# Patient Record
Sex: Male | Born: 1980 | Race: White | Hispanic: No | Marital: Married | Smoking: Never smoker
Health system: Southern US, Community
[De-identification: ages and names within clinical notes are randomized; demographics above are authoritative.]

## PROBLEM LIST (undated history)

## (undated) DIAGNOSIS — K635 Polyp of colon: Secondary | ICD-10-CM

## (undated) DIAGNOSIS — J45909 Unspecified asthma, uncomplicated: Secondary | ICD-10-CM

## (undated) DIAGNOSIS — K529 Noninfective gastroenteritis and colitis, unspecified: Secondary | ICD-10-CM

## (undated) DIAGNOSIS — L309 Dermatitis, unspecified: Secondary | ICD-10-CM

## (undated) DIAGNOSIS — K219 Gastro-esophageal reflux disease without esophagitis: Secondary | ICD-10-CM

## (undated) DIAGNOSIS — T7840XA Allergy, unspecified, initial encounter: Secondary | ICD-10-CM

## (undated) HISTORY — PX: FINGER SURGERY: SHX640

## (undated) HISTORY — DX: Noninfective gastroenteritis and colitis, unspecified: K52.9

## (undated) HISTORY — DX: Dermatitis, unspecified: L30.9

## (undated) HISTORY — DX: Allergy, unspecified, initial encounter: T78.40XA

## (undated) HISTORY — DX: Gastro-esophageal reflux disease without esophagitis: K21.9

## (undated) HISTORY — DX: Unspecified asthma, uncomplicated: J45.909

## (undated) HISTORY — PX: GANGLION CYST EXCISION: SHX1691

---
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
## 1998-04-30 DIAGNOSIS — I8390 Asymptomatic varicose veins of unspecified lower extremity: Secondary | ICD-10-CM | POA: Insufficient documentation

## 2004-04-30 DIAGNOSIS — M2550 Pain in unspecified joint: Secondary | ICD-10-CM | POA: Insufficient documentation

## 2015-05-18 DIAGNOSIS — M545 Low back pain, unspecified: Secondary | ICD-10-CM | POA: Insufficient documentation

## 2015-05-18 DIAGNOSIS — K529 Noninfective gastroenteritis and colitis, unspecified: Secondary | ICD-10-CM | POA: Insufficient documentation

## 2015-06-13 ENCOUNTER — Encounter: Payer: BLUE CROSS/BLUE SHIELD | Attending: Family Medicine | Admitting: Dietician

## 2015-06-13 ENCOUNTER — Encounter: Payer: Self-pay | Admitting: Dietician

## 2015-06-13 VITALS — Ht 72.0 in | Wt 276.0 lb

## 2015-06-13 DIAGNOSIS — E669 Obesity, unspecified: Secondary | ICD-10-CM

## 2015-06-13 NOTE — Patient Instructions (Signed)
Include at least 2 meals per day with a small snack either at breakfast or lunch. Try to include at least 5 servings per day. Balance meals with protein, 2-4 servings of carbohydrate and non-starchy vegetables Estimate carbohydrate servings at meals and snacks. Spread 13-15 servings of carbohydrate over 3 meals and 2-3 snacks. Measure some portions of carbohydrate foods, especially starches. Read labels for carbohydrate grams remembering that 15 gms of carbohydrate equals one serving.

## 2015-06-13 NOTE — Progress Notes (Signed)
Medical Nutrition Therapy: Visit start time: 1615  end time: 1715 Assessment:  Diagnosis: obesity Past medical history: 18 year hx of chronic diarrhea, GERD, food allergies- bananas, oranges, hazelnuts, melons Psychosocial issues/ stress concerns: Patient rates his stress as moderate and indicates "ok" as to how he is dealing with his stress. Preferred learning method:  . Auditory Current weight: 276.1 lbs  Height: 72 in Medications, supplements: see list Progress and evaluation:  Patient in for initial nutrition assessment. States that weight loss is his main focus. He has been taking phentermine for approximately 3 weeks which has suppressed his appetite. He has lost approximately 10 lbs in that time. He reports that his previous diet was very high in fat, eating mainly fast foods at lunch and more fried foods for evening meal as well as large portions. Since making diet changes, he reports that diarrhea has resolved and he is now only having 1 bowel movement per day and has experienced some constipation. He presently is possibly over-restricting eating only prunes at breakfast, pretzels during his work day and an evening meal consisting of 6+ oz. of meat, and a starch such as potatoes or legumes. He eats an evening snack approximately 3 days per week. His present diet is low in vegetables, fruit, whole grains and fiber.  Physical activity: none  Dietary Intake:  Usual eating pattern includes 1 meals and 3 snacks per day. Dining out frequency: 3 meals per week.  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 6 oz. Lean meat (chicken, bison, lean ground beef), potatoes or legumes Snack: 3 x/week- 90 calorie ice cream  Beverages: water; has eliminated soda  Nutrition Care Education: Weight control: Discussed how a meal pattern of 3 meals and 1-2 snacks will increase chance of long-term success for weight loss as well  as fuel the body more evenly over the day. Instructed on a meal plan based on 2000 calories including carbohydrate counting and the ways to better balance carbohydrate, protein and non-starchy vegetables. Encouraged to make food choices based on what needs to be added to the diet to meet basic nutrient needs. Gave and reviewed sample menus.   Nutritional Diagnosis:  [place]-3.3 Overweight/obesity As related to history of high fat food choices and large portions.  As evidenced by diet history and history of 60 lb weight gain in past 3 years..  Intervention:  Patient set the following goals: Include at least 2 meals per day with a small snack either at breakfast or lunch. Try to include at least 5 servings per day of fruits/vegetables. Balance meals with protein, 2-4 servings of carbohydrate and non-starchy vegetables Estimate carbohydrate servings at meals and snacks. Spread 13-15 servings of carbohydrate over 2 meals and 2-3 snacks. Measure some portions of carbohydrate foods, especially starches. Read labels for carbohydrate grams remembering that 15 gms of carbohydrate equals one serving.  Education Materials given:  . Food lists/ Planning A Balanced Meal . Sample meal pattern/ menus . Goals/ instructions Learner/ who was taught:  . Patient  Level of understanding: . Partial understanding; needs review/ practice Learning barriers: . None Willingness to learn/ readiness for change: . Eager, change in progress Monitoring and Evaluation:  Dietary intake, exercise, , and body weight      follow up: 08/15/15 at 4:15

## 2015-06-17 DIAGNOSIS — Z8371 Family history of colonic polyps: Secondary | ICD-10-CM | POA: Insufficient documentation

## 2015-08-15 ENCOUNTER — Ambulatory Visit: Payer: BLUE CROSS/BLUE SHIELD | Admitting: Dietician

## 2015-09-16 ENCOUNTER — Encounter: Payer: Self-pay | Admitting: Dietician

## 2017-08-21 ENCOUNTER — Other Ambulatory Visit: Payer: Self-pay | Admitting: Family Medicine

## 2017-08-21 ENCOUNTER — Ambulatory Visit
Admission: RE | Admit: 2017-08-21 | Discharge: 2017-08-21 | Disposition: A | Discharge status: Home / Self Care | Payer: BLUE CROSS/BLUE SHIELD | Source: Ambulatory Visit | Attending: Family Medicine | Admitting: Family Medicine

## 2017-08-21 DIAGNOSIS — R52 Pain, unspecified: Secondary | ICD-10-CM

## 2017-08-21 DIAGNOSIS — R0781 Pleurodynia: Secondary | ICD-10-CM | POA: Diagnosis present

## 2017-08-21 DIAGNOSIS — M4186 Other forms of scoliosis, lumbar region: Secondary | ICD-10-CM | POA: Insufficient documentation

## 2017-11-11 DIAGNOSIS — N50811 Right testicular pain: Secondary | ICD-10-CM | POA: Insufficient documentation

## 2017-11-12 DIAGNOSIS — R109 Unspecified abdominal pain: Secondary | ICD-10-CM | POA: Insufficient documentation

## 2018-07-27 ENCOUNTER — Ambulatory Visit
Admission: EM | Admit: 2018-07-27 | Discharge: 2018-07-27 | Discharge status: Absent without leave | Payer: BLUE CROSS/BLUE SHIELD

## 2018-07-27 ENCOUNTER — Other Ambulatory Visit: Payer: Self-pay

## 2018-09-08 ENCOUNTER — Ambulatory Visit
Admission: EM | Admit: 2018-09-08 | Discharge: 2018-09-08 | Disposition: A | Discharge status: Home / Self Care | Payer: BLUE CROSS/BLUE SHIELD | Attending: Family Medicine | Admitting: Family Medicine

## 2018-09-08 ENCOUNTER — Ambulatory Visit (INDEPENDENT_AMBULATORY_CARE_PROVIDER_SITE_OTHER): Payer: BLUE CROSS/BLUE SHIELD

## 2018-09-08 ENCOUNTER — Encounter: Payer: Self-pay | Admitting: Emergency Medicine

## 2018-09-08 ENCOUNTER — Other Ambulatory Visit: Payer: Self-pay

## 2018-09-08 DIAGNOSIS — M546 Pain in thoracic spine: Secondary | ICD-10-CM

## 2018-09-08 HISTORY — DX: Polyp of colon: K63.5

## 2018-09-08 LAB — URINALYSIS, COMPLETE (UACMP) WITH MICROSCOPIC
Bacteria, UA: NONE SEEN
Bilirubin Urine: NEGATIVE
Glucose, UA: NEGATIVE mg/dL
Hgb urine dipstick: NEGATIVE
Ketones, ur: NEGATIVE mg/dL
Leukocytes,Ua: NEGATIVE
Nitrite: NEGATIVE
Protein, ur: NEGATIVE mg/dL
RBC / HPF: NONE SEEN RBC/hpf (ref 0–5)
Specific Gravity, Urine: 1.025 (ref 1.005–1.030)
Squamous Epithelial / HPF: NONE SEEN (ref 0–5)
WBC, UA: NONE SEEN WBC/hpf (ref 0–5)
pH: 5 (ref 5.0–8.0)

## 2018-09-08 MED ORDER — CYCLOBENZAPRINE HCL 10 MG PO TABS: 10.0000 mg | ORAL_TABLET | Freq: Every evening | ORAL | 0 refills | Status: DC | PRN

## 2018-09-08 NOTE — ED Triage Notes (Signed)
Pt c/o back pain. Pain located in the middle of his back and radiates to the right. Lying down makes the pain worse. Pain I causing him not to sleep well. Started about 4 days ago. He has been taking tylenol and ibuprofen and has helped.

## 2018-09-08 NOTE — ED Provider Notes (Signed)
[facility] ____________________________________________  Time seen: Approximately 10:58 AM  I have reviewed the triage vital signs and the nursing notes.   HISTORY  Chief Complaint Back Pain   HPI Rick Scott is a 38 y.o. male presenting for evaluation of mid back pain for last 4 days.  Patient states that the pain is predominantly in his mid back and sometimes radiates to right back, but does not radiate further.  States he feels the pain more when he lays down flat, and this does disrupt his sleeping.  States his back overall feels fine in the morning.  Also feels the pain when he arches his chest forward with elbows backwards and with reaching over his head.  States the pain is an aching type pain, currently mild.  Denies any abdominal pain, chest pain, epigastric discomfort, paresthesias, fevers, or shortness of breath.  No rash. Denies known injury or trauma. Denies dysuria, hematuria or abnormal urinary or bowel habits.  Patient states that he has chronic alternation of constipation and diarrhea, and states this is his normal without any acute change.  Last bowel movement was yesterday and described as normal with normal coloration, no blood.  Has previously had 1 kidney stone several years ago, without intervention required.  History of lumbar herniated disc pain resolved after spinal injections.  Has been intermittently taken Tylenol and ibuprofen over-the-counter which do help for a few hours.  Denies other aggravating alleviating factors.  PCP: [facility]   Past Medical History:  Diagnosis Date  . Allergy bananas, oranges, melons  . Chronic diarrhea    has improved recently as diet has improved  . Colon polyps   . GERD (gastroesophageal reflux disease)     There are no active problems to display for this patient.   Past Surgical History:  Procedure Laterality Date  . GANGLION CYST EXCISION       No current facility-administered medications for  this encounter.   Current Outpatient Medications:  .  azelastine (ASTELIN) 0.1 % nasal spray, , Disp: , Rfl:  .  Cetirizine HCl 10 MG CAPS, , Disp: , Rfl:  .  Ibuprofen 200 MG CAPS, Take by mouth as needed., Disp: , Rfl:  .  TRULANCE 3 MG TABS, , Disp: , Rfl:  .  cyclobenzaprine (FLEXERIL) 10 MG tablet, Take 1 tablet (10 mg total) by mouth at bedtime as needed for muscle spasms (pain). Do not drive while taking as can cause drowsiness, Disp: 15 tablet, Rfl: 0  Allergies Codeine  Family History  Problem Relation Age of Onset  . COPD Mother   . Diabetes Mother   . Cancer Mother   . Healthy Father     Social History Social History   Tobacco Use  . Smoking status: Never Smoker  . Smokeless tobacco: Never Used  Substance Use Topics  . Alcohol use: Not Currently    Alcohol/week: 0.0 standard drinks  . Drug use: Not Currently    Review of Systems Constitutional: No fever Cardiovascular: Denies chest pain. Respiratory: Denies shortness of breath. Gastrointestinal: No abdominal pain.  No nausea, no vomiting.   Genitourinary: Negative for dysuria. Musculoskeletal: Positive for back pain. Negative for extremity pain, paresthesias, or discomfort.  Skin: Negative for rash. Neurological: Negative for headaches, focal weakness or numbness.    ____________________________________________   PHYSICAL EXAM:  VITAL SIGNS: ED Triage Vitals  Enc Vitals Group     BP 09/08/18 1013 111/82     Pulse Rate 09/08/18 1013 81  Resp 09/08/18 1013 18     Temp 09/08/18 1013 98.2 F (36.8 C)     Temp Source 09/08/18 1013 Oral     SpO2 09/08/18 1013 99 %     Weight 09/08/18 1009 260 lb (117.9 kg)     Height 09/08/18 1009 6' (1.829 m)     Head Circumference --      Peak Flow --      Pain Score 09/08/18 1009 2     Pain Loc --      Pain Edu? --      Excl. in [place]? --     Constitutional: Alert and oriented. Well appearing and in no acute distress. ENT      Head: Normocephalic and  atraumatic. Cardiovascular: Normal rate, regular rhythm. Grossly normal heart sounds.  Good peripheral circulation. Respiratory: Normal respiratory effort without tachypnea nor retractions. Breath sounds are clear and equal bilaterally. No wheezes, rales, rhonchi. Gastrointestinal: Soft and nontender. No CVA tenderness. Musculoskeletal: No midline cervical or lumbar tenderness palpation.  Steady gait.  Changes positions quickly in room.  Bilateral pedal pulses equal and easily palpated. No midline thoracic tenderness to palpation, however tenderness present to midline thoracic and parathoracic at approximately T10 with left and right arm overhead stretching as well as pulling elbows backwards, no rash, no bulge, full range of motion present, no pain with lumbar flexion or extension, per patient mild pain with right and left lumbar rotation to thoracic back.  No saddle anesthesia. Neurologic:  Normal speech and language. No gross focal neurologic deficits are appreciated. Speech is normal. No gait instability.  Skin:  Skin is warm, dry and intact. No rash noted. Psychiatric: Mood and affect are normal. Speech and behavior are normal. Patient exhibits appropriate insight and judgment   ___________________________________________   LABS (all labs ordered are listed, but only abnormal results are displayed)  Labs Reviewed  URINALYSIS, COMPLETE (UACMP) WITH MICROSCOPIC   ____________________________________________  RADIOLOGY  Dg Thoracic Spine 2 View  Result Date: 09/08/2018 CLINICAL DATA:  38 year old male with mid to lower thoracic spine pain the past 4 days. No known injury. EXAM: THORACIC SPINE 2 VIEWS COMPARISON:  Prior chest x-ray with right rib series 08/21/2017 FINDINGS: No evidence of acute fracture or malalignment. Dextroconvex and rotary scoliosis is present with the apex at L1. No lytic or blastic osseous lesion is identified. Minimal degenerative changes with mild endplate spurring.  No significant loss of disc space height. High density pulmonary nodules bilaterally likely represent small calcified granuloma. IMPRESSION: 1. No acute fracture, malalignment or bony lesion. 2. Mild dextroconvex and rotary scoliosis of the lumbar spine with the apex at L1. 3. Mild multilevel degenerative endplate spurring. No significant loss of disc space height. Electronically Signed   By: Jacqulynn Cadet M.D.   On: 09/08/2018 11:22   ____________________________________________   PROCEDURES Procedures   _____________________________________   INITIAL IMPRESSION / ASSESSMENT AND PLAN / ED COURSE  Pertinent labs & imaging results that were available during my care of the patient were reviewed by me and considered in my medical decision making (see chart for details).  Well-appearing patient.  No acute distress.  Presenting for evaluation of 4 days of lower thoracic back pain.  Discussed multiple differentials with patient, suspect musculoskeletal.  Urinalysis overall unremarkable.  Thoracic spine x-ray as above per radiologist, discussed the results with patient, no acute fracture malalignment or lesion, mild dextroconvex and very scoliosis as well as multilevel degenerative changes.  Also informed of incidental noting  of calcified granuloma, and recommend for follow-up with primary care regarding.  Suspect musculoskeletal back pain.  Continue over-the-counter Tylenol ibuprofen during the day as needed, Flexeril at night.  Recommend follow-up with orthopedic, as well as stretching.  Patient states that his wife is a physical therapist and will work with him him for thoracic stretching.Discussed indication, risks and benefits of medications with patient.  Discussed follow up with Primary care physician this week. Discussed follow up and return parameters including no resolution or any worsening concerns. Patient verbalized understanding and agreed to plan.    ____________________________________________   FINAL CLINICAL IMPRESSION(S) / ED DIAGNOSES  Final diagnoses:  Acute midline thoracic back pain     ED Discharge Orders         Ordered    cyclobenzaprine (FLEXERIL) 10 MG tablet  At bedtime PRN     09/08/18 1138           Note: This dictation was prepared with Dragon dictation along with smaller phrase technology. Any transcriptional errors that result from this process are unintentional.         Marylene Land, NP 09/08/18 1146

## 2018-09-08 NOTE — Discharge Instructions (Addendum)
Take medication as prescribed. Rest. Drink plenty of fluids.  Continue over-the-counter Tylenol and ibuprofen as needed.  Stretch as discussed.  Follow-up with orthopedic for continued pain.  Follow up with your primary care physician this week as needed. Return to Urgent care for new or worsening concerns.

## 2019-02-11 ENCOUNTER — Encounter: Payer: Self-pay | Admitting: Dietician

## 2019-02-11 ENCOUNTER — Encounter: Payer: BLUE CROSS/BLUE SHIELD | Attending: Family Medicine | Admitting: Dietician

## 2019-02-11 ENCOUNTER — Other Ambulatory Visit: Payer: Self-pay

## 2019-02-11 VITALS — Ht 72.0 in | Wt 274.5 lb

## 2019-02-11 DIAGNOSIS — Z6837 Body mass index (BMI) 37.0-37.9, adult: Secondary | ICD-10-CM | POA: Diagnosis not present

## 2019-02-11 DIAGNOSIS — E6609 Other obesity due to excess calories: Secondary | ICD-10-CM

## 2019-02-11 NOTE — Patient Instructions (Signed)
   Reduce the frequency of dessert with/ after supper.   When eating restaurant meals, limit/ avoid fried foods and opt for baked/ grilled; plan to eat 1/2 portions (can eat all of the vegetables).  Use menus for appropriate nutritional balance and food portions.

## 2019-02-11 NOTE — Progress Notes (Signed)
Medical Nutrition Therapy: Visit start time: 1440  end time: 1545  Assessment:  Diagnosis: abnormal weight gain Past medical history: GERD, food allergies to several fruits, hazelnuts, seafood Psychosocial issues/ stress concerns: none  Preferred learning method:  . Auditory . Hands-on  Current weight: 274.5lbs Height: 6'0" Medications, supplements: reconciled list in medical record  Progress and evaluation:   Patient reports on and off dieting several times over years, loses 80lbs and then regains.   He has tried counting calories, pre-made meals, stress management-- have all helped promote weight loss.   Reports getting overheated easily when exercising which leads to nausea, so needs low intensity.    He has had chronic constipation, now treating with Trulance, which leads to diarrhea/ loose stools  He has multiple food allergies/ intolerances, including several fruits especially bananas and oranges (GI pain), also avocado and hazelnuts (burning in throat), tomato products (GERD). He does not eat seafood; he reports sensitivity to some food textures.    Physical activity: walking 60-120 minutes 0-1x a week (often at [facility] zoo)  Dietary Intake:  Usual eating pattern includes 3 meals and 1 snacks per day. Dining out frequency: 2-3 meals per week.  Breakfast: bagel with cream cheese; occ donut, coffee with equal and half & half 2 Tablespoons Snack: none Lunch: varies -- frozen pizza/ quick meal, usu at home Snack: occasional coffee drink; occasionally beef and cheese stick if long time between meals Supper: 8-9pm-- pt cooks wife works late -- [employer] with veg and potato; chicken alfredo with broccoli; tacos; hamburgers, bratwurst + veg + somtimes potato; often with dessert (wants to stop desserts) Snack: none  Beverages: water, coffee  Nutrition Care Education: Topics covered: weight control Basic nutrition: basic food groups, appropriate nutrient balance, appropriate meal and  snack schedule, general nutrition guidelines    Weight control: importance of low sugar and low fat choices, portion control strategies including increasing low-carb vegetables, estimated energy needs at 1700kcal, provided guidance for 45% CHO, 25% protein, and 30% fat; discussed options for balanced meals and lower-sugar and calorie dessert alternatives; discussed tracking food intake; role of physical activity; discussed appropriate frequency of weight checks as weekly to daily with understanding that weight will naturally fluctuate within a few pounds.  Advanced nutrition:  dining out   Nutritional Diagnosis:  [place]-3.3 Overweight/obesity As related to excess calories and inadequate physical activity, history of weight fluctuation.  As evidenced by patient with current BMI of 37, making some lifestyle changes to promote sustainable weight loss.  Intervention:   Instruction as noted above.  Patient has some understanding of basic nutrition and seeks help with making habit changes he can maintain over time.   He would like to avoid tracking food intake if possible.   Established goals for change with direction from patient.    Education Materials given:  . Plate Planner with food lists . Sample meal pattern/ menus . Goals/ instructions   Learner/ who was taught:  . Patient   Level of understanding: Marland Kitchen Verbalizes/ demonstrates competency   Demonstrated degree of understanding via:   Teach back Learning barriers: . None  Willingness to learn/ readiness for change: . Eager, change in progress  Monitoring and Evaluation:  Dietary intake, exercise, and body weight      follow up: 03/11/19 at 1:30pm

## 2019-03-11 ENCOUNTER — Encounter: Payer: Self-pay | Admitting: Dietician

## 2019-03-11 ENCOUNTER — Encounter: Payer: BLUE CROSS/BLUE SHIELD | Attending: Family Medicine | Admitting: Dietician

## 2019-03-11 ENCOUNTER — Other Ambulatory Visit: Payer: Self-pay

## 2019-03-11 VITALS — Ht 72.0 in | Wt 264.0 lb

## 2019-03-11 DIAGNOSIS — Z6837 Body mass index (BMI) 37.0-37.9, adult: Secondary | ICD-10-CM

## 2019-03-11 DIAGNOSIS — E6609 Other obesity due to excess calories: Secondary | ICD-10-CM

## 2019-03-11 NOTE — Progress Notes (Signed)
Medical Nutrition Therapy: Visit start time: [id number]  end time: 1400  Assessment:  Diagnosis: abnormal weight gain Medical history changes: none Psychosocial issues/ stress concerns: none   Current weight: 264.0lbs Height: 6'0" Medications, supplements: no changes since previous visit  Progress and evaluation:   Weight loss of over 10lbs since previous visit on 02/11/19.   Patient reports less restaurant foods recently and heatlhier choices when eating out.    He has decreased the frequency of desserts/ sweets eaten and is making lower-calorie choices when he does eat something sweet.   His goal is to lose 60-70lbs within the next year.   Physical activity: no structured activity due to breathing issues; plans to increase activity as he is able.   Dietary Intake:  Usual eating pattern includes 3 meals and 1 snacks per day. Dining out frequency: <2 meals per week.  Breakfast: 1/2 bagel with cream cheese or whole grain English muffin with peanut butter + coffee Snack: none Lunch: sandwich on thin bread with [place], cheese, sm amt mayo, mustard + brocc and cheese or riced cauliflower with cheese Snack: beef and cheese stick Supper: pork chop/ chicken breast + veg + beans/ legumes or occasionally baked or mashed potato; taco salad with no shell with lean meat/ gr [place]. Sometimes low-cal ice cream bar for dessert max 250kcal Snack: none Beverages: 1-2c coffee, water  Nutrition Care Education: Topics covered:  Weight control: determining reasonable weight loss rate, reviewed benefits of emphasizing low-carb vegetables and lean protein foods to help with satiety while controlling poritons of starchy foods and sweets.    Nutritional Diagnosis:  [place]-3.3 Overweight/obesity As related to history of excess calories and inactivity.  As evidenced by patient with current BMI of 35.8, following calorie-controlled eating pattern for ongoing weight loss.  Intervention:   Discussion as noted  above.  Patient has made positive dietary changes to promote weight loss, and is motivated to continue.  He feels he can continue with his current healthy habits. No additional goals for change at this time.       Learner/ who was taught:  . Patient   Level of understanding: Marland Kitchen Verbalizes/ demonstrates competency   Demonstrated degree of understanding via:   Teach back Learning barriers: . None  Willingness to learn/ readiness for change: . Eager, change in progress  Monitoring and Evaluation:  Dietary intake, exercise, and body weight      follow up: 04/13/19 at 3:00pm virtual visit

## 2019-03-11 NOTE — Patient Instructions (Signed)
   Continue with current eating pattern, great job!

## 2019-04-13 ENCOUNTER — Encounter: Payer: BLUE CROSS/BLUE SHIELD | Attending: Family Medicine | Admitting: Dietician

## 2019-04-13 VITALS — Ht 72.0 in | Wt 252.0 lb

## 2019-04-13 DIAGNOSIS — E6609 Other obesity due to excess calories: Secondary | ICD-10-CM | POA: Diagnosis not present

## 2019-04-13 DIAGNOSIS — Z713 Dietary counseling and surveillance: Secondary | ICD-10-CM | POA: Diagnosis not present

## 2019-04-13 DIAGNOSIS — Z6834 Body mass index (BMI) 34.0-34.9, adult: Secondary | ICD-10-CM

## 2019-04-13 NOTE — Progress Notes (Signed)
Medical Nutrition Therapy: Visit start time: [id number]  end time: 1525  Assessment:  Diagnosis: abnormal weight gain Medical history changes: no changes Psychosocial issues/ stress concerns: none  Current weight: 252lbs (patient reported, am weight) Height: 6'0" Medications, supplement changes: added Cymbalta, stopped Zoloft  Progress and evaluation:  . Weight loss of about 10-12lbs in the past 5 weeks.  . Patient continues to monitor his intake closely and is making healthy food choices, emphasizing lean protein foods and vegetables. He is limiting grains and sweets. . Incorporating physical activity is more difficult due to wearing mask with asthma.     Physical activity: some walking with dog, outings to zoo and science center  Dietary Intake:  Usual eating pattern includes 3 meals and 1 snacks per day. Dining out frequency: not assessed today.  Breakfast: English muffin with peanut butter; 1/2 bagel with cream cheese (< 250kcal) Snack: none Lunch: sugar free bread ([id number] 2 slices), with pastrami or [place] and cheese, small amount mayo + steamed cauliflower or broccoli with cheese sauce (350-400kcal total for meal per patient) Snack: cheese stick Supper: baked chicken, thin pork chop or other lean meat + beans/ peas + veg; tacos without shell; occasional 200kcal ice cream bar for dessert Snack: none Beverages: water, 1c. Coffee in am  Nutrition Care Education: Topics covered:     Weight control: reviewed progress since previous visit, determining reasonable weight loss rate, options for increasing physical activity, importance of contentment with eating pattern and food choices for ongoing success   Nutritional Diagnosis:  [place]-3.3 Overweight/obesity As related to history of excess calories and inactivity.  As evidenced by patient with current BMI of 34.18, following calorie-controlled eating pattern for ongoing weight loss.  Intervention:  . Discussion and instruction as noted  above. . Patient continues to lose weight steadily, and has concrete goals for maintaining healthy habits.  . No additional goals for change at this time.    Learner/ who was taught:  . Patient   Level of understanding: Marland Kitchen Verbalizes/ demonstrates competency   Demonstrated degree of understanding via:   Teach back Learning barriers: . None  Willingness to learn/ readiness for change: . Eager, change in progress  Monitoring and Evaluation:  Dietary intake, exercise, and body weight      follow up: 05/20/19 at 2:00pm

## 2019-04-28 DIAGNOSIS — J4540 Moderate persistent asthma, uncomplicated: Secondary | ICD-10-CM | POA: Insufficient documentation

## 2019-04-28 DIAGNOSIS — Z6834 Body mass index (BMI) 34.0-34.9, adult: Secondary | ICD-10-CM | POA: Insufficient documentation

## 2019-05-20 ENCOUNTER — Ambulatory Visit (INDEPENDENT_AMBULATORY_CARE_PROVIDER_SITE_OTHER): Payer: BLUE CROSS/BLUE SHIELD | Admitting: Dietician

## 2019-05-20 ENCOUNTER — Ambulatory Visit: Payer: BLUE CROSS/BLUE SHIELD | Admitting: Dietician

## 2019-05-20 VITALS — Ht 72.0 in | Wt 253.5 lb

## 2019-05-20 DIAGNOSIS — Z6834 Body mass index (BMI) 34.0-34.9, adult: Secondary | ICD-10-CM

## 2019-05-20 DIAGNOSIS — E6609 Other obesity due to excess calories: Secondary | ICD-10-CM

## 2019-05-20 NOTE — Progress Notes (Signed)
Medical Nutrition Therapy: Visit start time: [id number]  end time: 1410  Assessment:  Diagnosis: obesity Medical history changes: no changes Psychosocial issues/ stress concerns: no changes  Current weight: 253.5lbs Height: 6'0" Medications, supplement changes: no changes  Progress and evaluation:  . Patient reports some weight gain since last visit, highest weight has been 256lbs, due to holiday eating and travel, followed by mother's sudden death 05-31-19 -- he travelled to [place] and spent some time in hotel setting, therefore more restaurant meals.  Marland Kitchen He feels he is now back to normal eating pattern and is resuming some physical activity by increasing general daily movement and walking dog. Will be getting a puppy this weekend which will entail more walking.  . Patient has experienced some coughing soon after eating, and thinks it could be related to acid reflux, but unsure. He does occasionally have heartburn symptoms, not necessarily in conjunction with coughing. He uses Tums when needed.    Physical activity: walking dog, increased ADLs  Dietary Intake:  Usual eating pattern includes 3 meals and 1 snacks per day. Dining out frequency: not assessed today.  Breakfast: English muffin with peanut butter; 1/2 bagel with cream cheese Snack: none Lunch: thin sliced bread with meat and cheese + veg.; leftovers Snack: cheese stick Supper: lean meat + beans + low-carb veg Snack: none Beverages: water, coffee in am  Nutrition Care Education: Topics covered:  Weight control: reviewed progress since previous visit, reasonable weight loss rate, options for increasing physical activity Other: coughing after eating, whether related to reflux or other issue, advised discussing with PCP.   Nutritional Diagnosis:  [place]-3.3 Overweight/obesity As related to history of excess calories and limited physical activity.  As evidenced by patient with current BMI of 34, following eating pattern and increasing  physical activity to promote ongoing weight loss.  Intervention:  . Discussion as noted above. . Patient feels confident he can continue with healthy eating pattern and regular activity.  Marland Kitchen He will discuss his recent coughing episodes with PCP to determine cause.  Marland Kitchen He will schedule MNT follow-up after working to resolve this coughing issue.    Learner/ who was taught:  . Patient   Level of understanding: Marland Kitchen Verbalizes/ demonstrates competency   Demonstrated degree of understanding via:   Teach back Learning barriers: . None  Willingness to learn/ readiness for change: . Eager, change in progress  Monitoring and Evaluation:  Dietary intake, exercise, and body weight      follow up: prn

## 2019-06-11 ENCOUNTER — Ambulatory Visit
Admission: EM | Admit: 2019-06-11 | Discharge: 2019-06-11 | Disposition: A | Discharge status: Home / Self Care | Payer: BLUE CROSS/BLUE SHIELD | Attending: Family Medicine | Admitting: Family Medicine

## 2019-06-11 ENCOUNTER — Ambulatory Visit (INDEPENDENT_AMBULATORY_CARE_PROVIDER_SITE_OTHER): Payer: BLUE CROSS/BLUE SHIELD

## 2019-06-11 ENCOUNTER — Other Ambulatory Visit: Payer: Self-pay

## 2019-06-11 ENCOUNTER — Encounter: Payer: Self-pay | Admitting: Emergency Medicine

## 2019-06-11 DIAGNOSIS — W228XXA Striking against or struck by other objects, initial encounter: Secondary | ICD-10-CM

## 2019-06-11 DIAGNOSIS — M79674 Pain in right toe(s): Secondary | ICD-10-CM

## 2019-06-11 DIAGNOSIS — S99921A Unspecified injury of right foot, initial encounter: Secondary | ICD-10-CM | POA: Diagnosis not present

## 2019-06-11 MED ORDER — NAPROXEN 500 MG PO TABS: 500.0000 mg | ORAL_TABLET | Freq: Two times a day (BID) | ORAL | 0 refills | Status: DC

## 2019-06-11 MED ORDER — TRAMADOL HCL 50 MG PO TABS: 50.0000 mg | ORAL_TABLET | Freq: Four times a day (QID) | ORAL | 0 refills | Status: DC | PRN

## 2019-06-11 NOTE — ED Triage Notes (Addendum)
Patient in today c/o 2nd toe injury on his right foot. Patient states that he slipped on a dog bone and hit his toe on a piece of furniture last night.

## 2019-06-11 NOTE — Discharge Instructions (Addendum)
-  Naproxen: 1 tablet twice a day for pain inflammation.  Do not take ibuprofen while taking the naproxen -Can also use Tylenol for pain -Tramadol 1 tablet every 6 hours as needed for pain not improved with naproxen or Tylenol. -Ice elevation as noted on attached -Return to clinic if symptoms worsen or not improve

## 2019-06-11 NOTE — ED Provider Notes (Signed)
[facility]    CSN: [id number] Arrival date & time: 06/11/19  1423      History   Chief Complaint Chief Complaint  Patient presents with  . Toe Injury    DOI 06/10/19    HPI Rick Scott is a 39 y.o. male.   Patient is a 39 year old male who presents with complaint to right second toe.  Patient states that he slipped on dog bone in the house and kicked a piece of furniture.  Patient states he is not take any medications or done anything for the injury.  Patient states the pain is all worse this morning as well as a swelling.  Patient reports allergies to codeine.     Past Medical History:  Diagnosis Date  . Allergy bananas, oranges, melons  . Chronic diarrhea    has improved recently as diet has improved  . Colon polyps   . GERD (gastroesophageal reflux disease)     There are no problems to display for this patient.   Past Surgical History:  Procedure Laterality Date  . GANGLION CYST EXCISION         Home Medications    Prior to Admission medications   Medication Sig Start Date End Date Taking? Authorizing Provider  albuterol (VENTOLIN HFA) 108 (90 Base) MCG/ACT inhaler Inhale into the lungs. 12/05/16  Yes [provider]  calcium elemental as carbonate (BARIATRIC TUMS ULTRA) 400 MG chewable tablet Chew by mouth.   Yes [provider]  Cetirizine HCl 10 MG CAPS  04/30/08  Yes [provider]  DULoxetine (CYMBALTA) 20 MG capsule Take by mouth. 03/19/19 03/18/20 Yes [provider]  Ibuprofen 200 MG CAPS Take by mouth as needed.   Yes [provider]  mometasone (NASONEX) 50 MCG/ACT nasal spray Place 2 sprays into the nose daily. 06/08/19  Yes [provider]  Multiple Vitamin (MULTIVITAMIN) tablet Take 1 tablet by mouth daily.   Yes [provider]  phentermine 15 MG capsule Take by mouth. 01/27/19  Yes [provider]  TRULANCE 3 MG TABS  09/05/18  Yes [provider]    naproxen (NAPROSYN) 500 MG tablet Take 1 tablet (500 mg total) by mouth 2 (two) times daily. 06/11/19   Luvenia Redden, PA-C  traMADol (ULTRAM) 50 MG tablet Take 1 tablet (50 mg total) by mouth every 6 (six) hours as needed. 06/11/19   Luvenia Redden, PA-C  azelastine (ASTELIN) 0.1 % nasal spray  09/05/18 06/11/19  [provider]    Family History Family History  Problem Relation Age of Onset  . COPD Mother   . Diabetes Mother   . Cancer Mother   . Healthy Father     Social History Social History   Tobacco Use  . Smoking status: Never Smoker  . Smokeless tobacco: Never Used  Substance Use Topics  . Alcohol use: Not Currently    Alcohol/week: 0.0 standard drinks  . Drug use: Not Currently     Allergies   Avocado, Banana, Corylus, Orange oil, Codeine, Fluticasone, Other, and Bupropion   Review of Systems Review of Systems as noted above in HPI.  Other systems reviewed and found to be negative   Physical Exam Triage Vital Signs ED Triage Vitals  Enc Vitals Group     BP 06/11/19 1438 114/85     Pulse Rate 06/11/19 1438 84     Resp 06/11/19 1438 18     Temp 06/11/19 1438 98.3 F (36.8 C)  Temp Source 06/11/19 1438 Oral     SpO2 06/11/19 1438 99 %     Weight 06/11/19 1438 255 lb (115.7 kg)     Height 06/11/19 1438 6' (1.829 m)     Head Circumference --      Peak Flow --      Pain Score 06/11/19 1437 4     Pain Loc --      Pain Edu? --      Excl. in [place]? --    No data found.  Updated Vital Signs BP 114/85 (BP Location: Left Arm)   Pulse 84   Temp 98.3 F (36.8 C) (Oral)   Resp 18   Ht 6' (1.829 m)   Wt 255 lb (115.7 kg)   SpO2 99%   BMI 34.58 kg/m    Physical Exam Constitutional:      Appearance: Normal appearance.  Musculoskeletal:       Feet:  Neurological:     General: No focal deficit present.     Mental Status: He is alert and oriented to person, place, and time.      UC Treatments / Results  Labs (all labs ordered are  listed, but only abnormal results are displayed) Labs Reviewed - No data to display  EKG   Radiology DG Toe 2nd Right  Result Date: 06/11/2019 CLINICAL DATA:  39 year old who struck the RIGHT second toe on furniture at home last night. Persistent pain. Initial encounter. EXAM: RIGHT SECOND TOE - 3 VIEWS COMPARISON:  None. FINDINGS: No evidence of acute fracture or dislocation. Joint spaces well preserved. Well-preserved bone mineral density. No intrinsic osseous abnormalities. IMPRESSION: Normal examination. Electronically Signed   By: Evangeline Dakin M.D.   On: 06/11/2019 15:00    Procedures Procedures (including critical care time)  Medications Ordered in UC Medications - No data to display  Initial Impression / Assessment and Plan / UC Course  I have reviewed the triage vital signs and the nursing notes.  Pertinent labs & imaging results that were available during my care of the patient were reviewed by me and considered in my medical decision making (see chart for details).     Patient with pain and swelling to the right second toe after slipping on a dog bone and kicking a piece of furniture.  Pain and tenderness around the first PIP joint.  Some decreased range of motion due to pain.  X-ray with no acute fracture dislocation.  Patient given a formation regarding buddy tape ice and elevation.  Patient prescription for naproxen as well as Ultram if needed.  Final Clinical Impressions(s) / UC Diagnoses   Final diagnoses:  Toe injury, right, initial encounter     Discharge Instructions     -Naproxen: 1 tablet twice a day for pain inflammation.  Do not take ibuprofen while taking the naproxen -Can also use Tylenol for pain -Tramadol 1 tablet every 6 hours as needed for pain not improved with naproxen or Tylenol. -Ice elevation as noted on attached -Return to clinic if symptoms worsen or not improve    ED Prescriptions    Medication Sig Dispense Auth. Provider    naproxen (NAPROSYN) 500 MG tablet Take 1 tablet (500 mg total) by mouth 2 (two) times daily. 30 tablet Luvenia Redden, PA-C   traMADol (ULTRAM) 50 MG tablet Take 1 tablet (50 mg total) by mouth every 6 (six) hours as needed. 15 tablet Luvenia Redden, PA-C     I have reviewed the [place]  during this encounter.   Luvenia Redden, PA-C 06/11/19 1517

## 2019-07-01 DIAGNOSIS — G4733 Obstructive sleep apnea (adult) (pediatric): Secondary | ICD-10-CM | POA: Insufficient documentation

## 2019-07-05 ENCOUNTER — Ambulatory Visit: Payer: BLUE CROSS/BLUE SHIELD | Attending: Internal Medicine

## 2019-07-05 ENCOUNTER — Ambulatory Visit: Payer: BLUE CROSS/BLUE SHIELD

## 2019-07-05 DIAGNOSIS — Z23 Encounter for immunization: Secondary | ICD-10-CM

## 2019-07-05 NOTE — Progress Notes (Signed)
   Covid-19 Vaccination Clinic  Name:  Rick Scott    MRN: [id number] DOB: [birth year]  07/05/2019  Mr. Rick Scott was observed post Covid-19 immunization for 15 minutes without incident. He was provided with Vaccine Information Sheet and instruction to access the V-Safe system.   Mr. Rick Scott was instructed to call 911 with any severe reactions post vaccine: Marland Kitchen Difficulty breathing  . Swelling of face and throat  . A fast heartbeat  . A bad rash all over body  . Dizziness and weakness   Immunizations Administered    Name Date Dose VIS Date Route   Pfizer COVID-19 Vaccine 07/05/2019  1:58 PM 0.3 mL 04/10/2019 Intramuscular   Manufacturer: [place]   Lot: [id number]   [place]: [id number]

## 2019-07-29 ENCOUNTER — Ambulatory Visit: Payer: BLUE CROSS/BLUE SHIELD | Attending: Internal Medicine

## 2019-07-29 DIAGNOSIS — Z23 Encounter for immunization: Secondary | ICD-10-CM

## 2019-07-29 NOTE — Progress Notes (Signed)
   Covid-19 Vaccination Clinic  Name:  Rick Scott    MRN: [id number] DOB: 02/02/1981  07/29/2019  [name] was observed post Covid-19 immunization for 15 minutes without incident. He was provided with Vaccine Information Sheet and instruction to access the V-Safe system.   [name] was instructed to call 911 with any severe reactions post vaccine: Marland Kitchen Difficulty breathing  . Swelling of face and throat  . A fast heartbeat  . A bad rash all over body  . Dizziness and weakness   Immunizations Administered    Name Date Dose VIS Date Route   Pfizer COVID-19 Vaccine 07/29/2019  3:02 PM 0.3 mL 04/10/2019 Intramuscular   Manufacturer: [place]   Lot: [id number]   [place]: [id number]

## 2020-07-18 ENCOUNTER — Other Ambulatory Visit: Payer: Self-pay

## 2020-07-18 ENCOUNTER — Ambulatory Visit (INDEPENDENT_AMBULATORY_CARE_PROVIDER_SITE_OTHER): Payer: No Typology Code available for payment source | Admitting: Vascular Surgery

## 2020-07-18 ENCOUNTER — Encounter (INDEPENDENT_AMBULATORY_CARE_PROVIDER_SITE_OTHER): Payer: Self-pay | Admitting: Vascular Surgery

## 2020-07-18 VITALS — BP 117/81 | HR 88 | Resp 16 | Ht 72.0 in | Wt 276.0 lb

## 2020-07-18 DIAGNOSIS — J4540 Moderate persistent asthma, uncomplicated: Secondary | ICD-10-CM | POA: Diagnosis not present

## 2020-07-18 DIAGNOSIS — I8392 Asymptomatic varicose veins of left lower extremity: Secondary | ICD-10-CM | POA: Diagnosis not present

## 2020-07-18 NOTE — Progress Notes (Signed)
MRN : [id number]  Rick Scott is a 40 y.o. ([birth year]) male who presents with chief complaint of  Chief Complaint  Patient presents with  . New Patient (Initial Visit)    Ref uncomplicated vv with knee swelling  .  History of Present Illness:   The patient is seen for evaluation of an asymptomatic varicose vein. Recently he had a "knot" come up abruptly over the left knee that seemed to be related to the large varicose vein in that area.  Symptoms have essentially resolved at this time.  No history of trauma.  There is no history of DVT, PE or prior superficial thrombophlebitis. There is no history of ulceration or hemorrhage. The patient has a significant family history of varicose veins in his father who did have and ablation procedure which was complicated with DVT.  There is no history of prior surgical intervention or sclerotherapy.    Current Meds  Medication Sig  . albuterol (VENTOLIN HFA) 108 (90 Base) MCG/ACT inhaler Inhale into the lungs.  . calcium elemental as carbonate (BARIATRIC TUMS ULTRA) 400 MG chewable tablet Chew by mouth.  . Cetirizine HCl 10 MG CAPS   . Ibuprofen 200 MG CAPS Take by mouth as needed.  Marland Kitchen LINZESS 72 MCG capsule Take 72 mcg by mouth every morning.  . metFORMIN (GLUCOPHAGE-XR) 500 MG 24 hr tablet Take 500 mg by mouth daily.  . mometasone (NASONEX) 50 MCG/ACT nasal spray Place 2 sprays into the nose daily.  . Multiple Vitamin (MULTIVITAMIN) tablet Take 1 tablet by mouth daily.  . naproxen (NAPROSYN) 500 MG tablet Take 1 tablet (500 mg total) by mouth 2 (two) times daily.  . traMADol (ULTRAM) 50 MG tablet Take 1 tablet (50 mg total) by mouth every 6 (six) hours as needed.    Past Medical History:  Diagnosis Date  . Allergy bananas, oranges, melons  . Chronic diarrhea    has improved recently as diet has improved  . Colon polyps   . GERD (gastroesophageal reflux disease)     Past Surgical History:  Procedure Laterality Date  .  GANGLION CYST EXCISION      Social History Social History   Tobacco Use  . Smoking status: Never Smoker  . Smokeless tobacco: Never Used  Vaping Use  . Vaping Use: Never used  Substance Use Topics  . Alcohol use: Not Currently    Alcohol/week: 0.0 standard drinks  . Drug use: Not Currently    Family History Family History  Problem Relation Age of Onset  . COPD Mother   . Diabetes Mother   . Cancer Mother   . Healthy Father   No family history of bleeding/clotting disorders, porphyria or autoimmune disease   Allergies  Allergen Reactions  . Avocado Swelling    Burning in throat   . Banana Swelling  . Corylus Swelling  . Orange Oil Nausea And Vomiting  . Codeine Nausea And Vomiting    Flu like symptoms  . Fluticasone Other (See Comments)    Urinary frequency/incontinence  . [place]) Allergy Skin Test   . Other Other (See Comments)    Burning in throat Burning in throat   . Bupropion Other (See Comments)    Dissociative symptoms     REVIEW OF SYSTEMS (Negative unless checked)  Constitutional: [] Weight loss  [] Fever  [] Chills Cardiac: [] Chest pain   [] Chest pressure   [] Palpitations   [] Shortness of breath when laying flat   [] Shortness of breath with exertion. Vascular:  []   Pain in legs with walking   [] Pain in legs at rest  [] History of DVT   [] Phlebitis   [] Swelling in legs   [x] Varicose veins   [] Non-healing ulcers Pulmonary:   [] Uses home oxygen   [] Productive cough   [] Hemoptysis   [] Wheeze  [] COPD   [] Asthma Neurologic:  [] Dizziness   [] Seizures   [] History of stroke   [] History of TIA  [] Aphasia   [] Vissual changes   [] Weakness or numbness in arm   [] Weakness or numbness in leg Musculoskeletal:   [] Joint swelling   [] Joint pain   [] Low back pain Hematologic:  [] Easy bruising  [] Easy bleeding   [] Hypercoagulable state   [] Anemic Gastrointestinal:  [] Diarrhea   [] Vomiting  [] Gastroesophageal reflux/heartburn   [] Difficulty  swallowing. Genitourinary:  [] Chronic kidney disease   [] Difficult urination  [] Frequent urination   [] Blood in urine Skin:  [] Rashes   [] Ulcers  Psychological:  [] History of anxiety   []  History of major depression.  Physical Examination  Vitals:   07/18/20 0940  BP: 117/81  Pulse: 88  Resp: 16  Weight: 276 lb (125.2 kg)  Height: 6' (1.829 m)   Body mass index is 37.43 kg/m. Gen: WD/WN, NAD Head: [place]/AT, No temporalis wasting.  Ear/Nose/Throat: Hearing grossly intact, nares w/o erythema or drainage, poor dentition Eyes: PER, EOMI, sclera nonicteric.  Neck: Supple, no masses.  No bruit or JVD.  Pulmonary:  Good air movement, no audible wheezing  bilaterally, no use of accessory muscles.  Cardiac: RRR,  Vascular: Large varicosities greater than 10 mm crossing the left knee.  Trace edema Vessel Right Left  Radial Palpable Palpable  Gastrointestinal: soft, non-distended. No guarding/no peritoneal signs.  Musculoskeletal: M/S 5/5 throughout.  No deformity or atrophy.  Neurologic: CN 2-12 intact. Symmetrical.  Speech is fluent. Motor exam as listed above. Psychiatric: Judgment intact, Mood & affect appropriate for pt's clinical situation. Dermatologic: No rashes or ulcers noted.  No changes consistent with cellulitis.   CBC No results found for: WBC, HGB, HCT, MCV, PLT  BMET No results found for: NA, K, CL, CO2, GLUCOSE, BUN, CREATININE, CALCIUM, GFRNONAA, GFRAA CrCl cannot be calculated (No successful lab value found.).  COAG No results found for: INR, PROTIME  Radiology No results found.   Assessment/Plan 1. Asymptomatic varicose veins of left lower extremity Recommend:  The patient notes a large left varicose vein. I am suspicious that the recent episode was STP.  No further surgery or testing is indicated at this time   I have had a long discussion with the patient regarding  varicose veins and why they cause symptoms.    In addition, behavioral modification  including elevation during the day will be initiated, utilizing a recliner was recommended.  The patient is also instructed to continue exercising such as walking 4-5 times per week.  At this time the patient wishes to continue conservative therapy and is not interested in more invasive treatments such as laser ablation and sclerotherapy.  The Patient will follow up PRN if the symptoms worsen.  2. Moderate persistent asthma without complication Continue pulmonary medications and aerosols as already ordered, these medications have been reviewed and there are no changes at this time.      Hortencia Pilar, MD  07/18/2020 10:13 AM

## 2020-11-24 ENCOUNTER — Other Ambulatory Visit: Payer: Self-pay

## 2020-11-24 ENCOUNTER — Ambulatory Visit (INDEPENDENT_AMBULATORY_CARE_PROVIDER_SITE_OTHER): Payer: No Typology Code available for payment source | Admitting: Nurse Practitioner

## 2020-11-24 ENCOUNTER — Encounter: Payer: Self-pay | Admitting: Nurse Practitioner

## 2020-11-24 VITALS — BP 120/84 | HR 87 | Temp 98.3°F | Ht 72.28 in | Wt 258.4 lb

## 2020-11-24 DIAGNOSIS — M25562 Pain in left knee: Secondary | ICD-10-CM | POA: Diagnosis not present

## 2020-11-24 DIAGNOSIS — R21 Rash and other nonspecific skin eruption: Secondary | ICD-10-CM

## 2020-11-24 NOTE — Progress Notes (Signed)
BP 120/84   Pulse 87   Temp 98.3 F (36.8 C)   Ht 6' 0.28" (1.836 m)   Wt 258 lb 6 oz (117.2 kg)   SpO2 96%   BMI 34.77 kg/m    Subjective:    Patient ID: Rick Scott, male    DOB: 11/17/1980, 40 y.o.   MRN: [id number]  HPI: Rick Scott is a 40 y.o. male  Chief Complaint  Patient presents with   Knee Pain   Rash    X couple months right upper outer chest and left upper thigh   Patient presents to clinic to establish care with new PCP.  Patient reports a history of IBS with constipation and has been on Linzess for a couple of years was previously getting Linzess from his primary care. Patient states he was prediabetic for a little while and takes the Metformin for that.   Patient denies a history of: Hypertension, Elevated Cholesterol, Thyroid problems, Depression, Anxiety, Neurological problems,   RASH Patient states he has a rash on his chest on his upper chest for a couple of months.  He says the rash comes and goes.  He states he gets the rash every year but it comes back in a different place each year.  He has been using triamcinolone cream which he isn't sure is helping.    KNEE PAIN Duration: months Involved knee: left Mechanism of injury: unknown Location:medial Onset: gradual Severity: 5/10  Quality:  aching Frequency: intermittent Radiation: no Aggravating factors: stairs  Alleviating factors: NSAIDs  Status: stable Treatments attempted: ice, heat, and ibuprofen  Relief with NSAIDs?:  moderate Weakness with weight bearing or walking: no Sensation of giving way: no Locking: no Popping: yes Bruising: no Swelling: yes Redness: no Paresthesias/decreased sensation: no Fevers: no   Active Ambulatory Problems    Diagnosis Date Noted   Abdominal pain with radiation to back 11/12/2017   Asthma due to environmental allergies 04/30/1990   Allergies 03/04/1979   Asthma, moderate persistent 04/28/2019   Asymptomatic varicose veins of  unspecified lower extremity 04/30/1998   BMI 34.0-34.9,adult 04/28/2019   Chronic diarrhea 05/18/2015   Low back pain, unspecified 05/18/2015   Family history of colonic polyps 06/17/2015   Gastric reflux 04/30/1994   Irritable bowel syndrome with constipation 08/29/1994   Joint pain 04/30/2004   OSA (obstructive sleep apnea) 07/01/2019   Testicular pain, right 11/11/2017   Resolved Ambulatory Problems    Diagnosis Date Noted   No Resolved Ambulatory Problems   Past Medical History:  Diagnosis Date   Allergy bananas, oranges, melons   Asthma 1996   Colon polyps    GERD (gastroesophageal reflux disease)    Past Surgical History:  Procedure Laterality Date   GANGLION CYST EXCISION     Family History  Problem Relation Age of Onset   COPD Mother    Diabetes Mother    Cancer Mother    Heart disease Mother    Obesity Mother    Healthy Father    Varicose Veins Father    ADD / ADHD Daughter    Cancer Maternal Grandmother     Relevant past medical, surgical, family and social history reviewed and updated as indicated. Interim medical history since our last visit reviewed. Allergies and medications reviewed and updated.  Review of Systems  Per HPI unless specifically indicated above     Objective:    BP 120/84   Pulse 87   Temp 98.3 F (36.8 C)  Ht 6' 0.28" (1.836 m)   Wt 258 lb 6 oz (117.2 kg)   SpO2 96%   BMI 34.77 kg/m   Wt Readings from Last 3 Encounters:  11/24/20 258 lb 6 oz (117.2 kg)  07/18/20 276 lb (125.2 kg)  06/11/19 255 lb (115.7 kg)    Physical Exam Vitals and nursing note reviewed.  Constitutional:      General: He is not in acute distress.    Appearance: Normal appearance. He is not ill-appearing, toxic-appearing or diaphoretic.  HENT:     Head: Normocephalic.     Right Ear: External ear normal.     Left Ear: External ear normal.     Nose: Nose normal. No congestion or rhinorrhea.     Mouth/Throat:     Mouth: Mucous membranes are moist.   Eyes:     General:        Right eye: No discharge.        Left eye: No discharge.     Extraocular Movements: Extraocular movements intact.     Conjunctiva/sclera: Conjunctivae normal.     Pupils: Pupils are equal, round, and reactive to light.  Cardiovascular:     Rate and Rhythm: Normal rate and regular rhythm.     Heart sounds: No murmur heard. Pulmonary:     Effort: Pulmonary effort is normal. No respiratory distress.     Breath sounds: Normal breath sounds. No wheezing, rhonchi or rales.  Abdominal:     General: Abdomen is flat. Bowel sounds are normal.  Musculoskeletal:        General: No swelling, tenderness or deformity. Normal range of motion.     Cervical back: Normal range of motion and neck supple.     Right lower leg: No edema.     Left lower leg: No edema.  Skin:    General: Skin is warm and dry.     Capillary Refill: Capillary refill takes less than 2 seconds.       Neurological:     General: No focal deficit present.     Mental Status: He is alert and oriented to person, place, and time.  Psychiatric:        Mood and Affect: Mood normal.        Behavior: Behavior normal.        Thought Content: Thought content normal.        Judgment: Judgment normal.    Results for orders placed or performed during the hospital encounter of 09/08/18  Urinalysis, Complete w Microscopic  Result Value Ref Range   Color, Urine YELLOW YELLOW   APPearance CLEAR CLEAR   Specific Gravity, Urine 1.025 1.005 - 1.030   pH 5.0 5.0 - 8.0   Glucose, UA NEGATIVE NEGATIVE mg/dL   Hgb urine dipstick NEGATIVE NEGATIVE   Bilirubin Urine NEGATIVE NEGATIVE   Ketones, ur NEGATIVE NEGATIVE mg/dL   Protein, ur NEGATIVE NEGATIVE mg/dL   Nitrite NEGATIVE NEGATIVE   Leukocytes,Ua NEGATIVE NEGATIVE   Squamous Epithelial / LPF NONE SEEN 0 - 5   WBC, UA NONE SEEN 0 - 5 WBC/hpf   RBC / HPF NONE SEEN 0 - 5 RBC/hpf   Bacteria, UA NONE SEEN NONE SEEN   Uric Acid Crys, UA PRESENT        Assessment & Plan:   Problem List Items Addressed This Visit   None Visit Diagnoses     Rash    -  Primary   Referral placed for Dermatology to have reoccuring  rash. Continue with Triamcinalone cream at this time.   Relevant Orders   Ambulatory referral to Dermatology   Acute pain of left knee       Recommend patient see physical therapy for knee pain. Patient will call back if he decides to go through with knee pain.        Follow up plan: Return in about 6 months (around 05/27/2021) for IBS.

## 2020-11-26 DIAGNOSIS — M2550 Pain in unspecified joint: Secondary | ICD-10-CM

## 2020-11-30 ENCOUNTER — Ambulatory Visit: Payer: No Typology Code available for payment source | Admitting: Dermatology

## 2020-11-30 ENCOUNTER — Other Ambulatory Visit: Payer: Self-pay

## 2020-11-30 DIAGNOSIS — L723 Sebaceous cyst: Secondary | ICD-10-CM

## 2020-11-30 DIAGNOSIS — L732 Hidradenitis suppurativa: Secondary | ICD-10-CM | POA: Diagnosis not present

## 2020-11-30 DIAGNOSIS — B354 Tinea corporis: Secondary | ICD-10-CM | POA: Diagnosis not present

## 2020-11-30 DIAGNOSIS — B356 Tinea cruris: Secondary | ICD-10-CM | POA: Diagnosis not present

## 2020-11-30 MED ORDER — TERBINAFINE HCL 250 MG PO TABS: 250.0000 mg | ORAL_TABLET | Freq: Every day | ORAL | 1 refills | Status: DC

## 2020-11-30 MED ORDER — DOXYCYCLINE HYCLATE 100 MG PO TABS: ORAL_TABLET | ORAL | 0 refills | Status: DC

## 2020-11-30 MED ORDER — KETOCONAZOLE 2 % EX CREA: TOPICAL_CREAM | CUTANEOUS | 0 refills | Status: DC

## 2020-11-30 NOTE — Progress Notes (Signed)
New Patient Visit  Subjective  Rick Scott is a 40 y.o. male who presents for the following: rash (X 6 months on the L thigh. Other areas that have been affected include the groin, popliteal areas, chest, forearms, and biceps. Patient has tried Ketoconazole 2% cream and TMC cream in the past. TMC helped with itching for about an hour. Patient states that Ketoconazole cream makes it worse. No one else in the home has it and he has had no changes in medications, body washes, laundry detergents etc.), skin lesions (R post thigh x 2 - bleeds ), and lump  (R axilla x 2 weeks).  The following portions of the chart were reviewed this encounter and updated as appropriate:   Tobacco  Allergies  Meds  Problems  Med Hx  Surg Hx  Fam Hx     Review of Systems:  No other skin or systemic complaints except as noted in HPI or Assessment and Plan.  Objective  Well appearing patient in no apparent distress; mood and affect are within normal limits.  A focused examination was performed including the groin, axilla and legs. Relevant physical exam findings are noted in the Assessment and Plan.   Assessment & Plan  Inflamed cyst R post axillary fold Inflamed but not abscessed -  Start Doxycycline '100mg'$  po BID x 7 days. Doxycycline should be taken with food to prevent nausea. Do not lay down for 30 minutes after taking. Be cautious with sun exposure and use good sun protection while on this medication. Pregnant women should not take this medication.   Recommend excision in the future when no longer inflamed to prevent recurrence of inflammation.   doxycycline (VIBRA-TABS) 100 MG tablet - R post axillary fold Take one tab po BID with food x 7 days.  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  Restart Ketoconazole 2% cream QHS x 2 mths.  Terbinafine Counseling  Terbinafine is an anti-fungal medicine that can be applied to the  skin (over the counter) or taken by mouth (prescription) to treat fungal infections. The pill version is often used to treat fungal infections of the nails or scalp. While most people do not have any side effects from taking terbinafine pills, some possible side effects of the medicine can include taste changes, headache, loss of smell, vision changes, nausea, vomiting, or diarrhea.   Rare side effects can include irritation of the liver, allergic reaction, or decrease in blood counts (which may show up as not feeling well or developing an infection). If you are concerned about any of these side effects, please stop the medicine and call your doctor, or in the case of an emergency such as feeling very unwell, seek immediate medical care.   terbinafine (LAMISIL) 250 MG tablet - L thigh Take 1 tablet (250 mg total) by mouth daily.  ketoconazole (NIZORAL) 2 % cream - L thigh Apply to aa's rash QHS.  Hidradenitis suppurativa Buttocks, R axilla  Currently mild - Hidradenitis Suppurativa is a chronic; persistent; non-curable, but treatable condition due to abnormal inflamed sweat glands in the body folds (axilla, inframammary, groin, medial thighs), causing recurrent painful cysts and scarring. It can be associated with severe scarring acne and cysts; abscesses and scarring of scalp. The goal is control and prevention of flares, as it is not curable. Scars are permanent and can be thickened. Treatment may include daily use of topical medication and oral  antibiotics.  Oral isotretinoin may also be helpful.  For more severe cases, Humira (a biologic injection) may be prescribed to decrease the inflammatory process and prevent flares.  When indicated, inflamed cysts may also be treated surgically.  Start Doxycycline '100mg'$  po BID x 7 days. Doxycycline should be taken with food to prevent nausea. Do not lay down for 30 minutes after taking. Be cautious with sun exposure and use good sun protection while on this  medication. Pregnant women should not take this medication.   Pamphlet given today for HS and Humira.   Return for rash and HS follow up in 5-6 weeks.  Luther Redo, CMA, am acting as scribe for Sarina Ser, MD . Documentation: I have reviewed the above documentation for accuracy and completeness, and I agree with the above.  Sarina Ser, MD

## 2020-11-30 NOTE — Patient Instructions (Signed)

## 2020-12-01 ENCOUNTER — Encounter: Payer: Self-pay | Admitting: Dermatology

## 2020-12-01 NOTE — Telephone Encounter (Signed)
Yes we can make it urgent.

## 2020-12-04 ENCOUNTER — Encounter: Payer: Self-pay | Admitting: Dermatology

## 2020-12-05 ENCOUNTER — Ambulatory Visit: Payer: No Typology Code available for payment source | Admitting: Dermatology

## 2020-12-05 ENCOUNTER — Other Ambulatory Visit: Payer: Self-pay

## 2020-12-05 DIAGNOSIS — R21 Rash and other nonspecific skin eruption: Secondary | ICD-10-CM

## 2020-12-05 MED ORDER — MOMETASONE FUROATE 0.1 % EX OINT: TOPICAL_OINTMENT | Freq: Two times a day (BID) | CUTANEOUS | 0 refills | Status: DC

## 2020-12-05 NOTE — Patient Instructions (Signed)

## 2020-12-05 NOTE — Progress Notes (Signed)
   Follow-Up Visit   Subjective  Rick Scott is a 40 y.o. male who presents for the following: Rash (Patient here today for reaction from medications at the penis. Patient was seen 4 days ago and started on doxycycline '100mg'$  BID for HS and ketoconazole cream for tinea cruris. Patient advises the reaction started about 2 days after starting medications. ).  The following portions of the chart were reviewed this encounter and updated as appropriate:   Tobacco  Allergies  Meds  Problems  Med Hx  Surg Hx  Fam Hx     Review of Systems:  No other skin or systemic complaints except as noted in HPI or Assessment and Plan.  Objective  Well appearing patient in no apparent distress; mood and affect are within normal limits.  A focused examination was performed including pubic. Relevant physical exam findings are noted in the Assessment and Plan.  Pubic Annular edematous red plaque of the glans penis    Assessment & Plan  Rash -fixed drug eruption secondary to doxycycline Penis /glans penis  Fixed drug eruption 2ndary to doxycycline Discontinue doxycycline -this is considered an allergy to doxycycline  Start mometasone ointment twice daily for a few weeks as needed for rash.  mometasone (ELOCON) 0.1 % ointment - Pubic Apply topically in the morning and at bedtime.  Return for as scheduled.  Graciella Belton, RMA, am acting as scribe for Sarina Ser, MD . Documentation: I have reviewed the above documentation for accuracy and completeness, and I agree with the above.  Sarina Ser, MD

## 2020-12-06 ENCOUNTER — Encounter: Payer: Self-pay | Admitting: Dermatology

## 2020-12-08 ENCOUNTER — Encounter: Payer: Self-pay | Admitting: Dermatology

## 2020-12-08 ENCOUNTER — Telehealth: Payer: Self-pay | Admitting: Dermatology

## 2020-12-08 NOTE — Telephone Encounter (Signed)
Spoke with patient. He only has burning briefly after applying mometasone ointment and then the sensation calms down. Itch is resolved but the area is still very red.   Discussed that all steroid ointments may burn and so changing medication may not help with this. Suggest applying vaseline several times through the day in addition to mometasone 2-3 more days until redness improving, then can discontinue mometasone and apply just vaseline. He is in agreement with this plan.   All questions answered.

## 2020-12-08 NOTE — Telephone Encounter (Signed)
Spoke with patient. See phone message.

## 2021-01-11 ENCOUNTER — Other Ambulatory Visit: Payer: Self-pay

## 2021-01-11 ENCOUNTER — Ambulatory Visit: Payer: No Typology Code available for payment source | Admitting: Dermatology

## 2021-01-11 DIAGNOSIS — T364X5A Adverse effect of tetracyclines, initial encounter: Secondary | ICD-10-CM

## 2021-01-11 DIAGNOSIS — L650 Telogen effluvium: Secondary | ICD-10-CM

## 2021-01-11 DIAGNOSIS — B354 Tinea corporis: Secondary | ICD-10-CM

## 2021-01-11 DIAGNOSIS — L732 Hidradenitis suppurativa: Secondary | ICD-10-CM | POA: Diagnosis not present

## 2021-01-11 DIAGNOSIS — L80 Vitiligo: Secondary | ICD-10-CM

## 2021-01-11 DIAGNOSIS — L271 Localized skin eruption due to drugs and medicaments taken internally: Secondary | ICD-10-CM | POA: Diagnosis not present

## 2021-01-11 DIAGNOSIS — L720 Epidermal cyst: Secondary | ICD-10-CM

## 2021-01-11 DIAGNOSIS — B356 Tinea cruris: Secondary | ICD-10-CM | POA: Diagnosis not present

## 2021-01-11 MED ORDER — CLINDAMYCIN PHOSPHATE 1 % EX LOTN: TOPICAL_LOTION | CUTANEOUS | 6 refills | Status: DC

## 2021-01-11 NOTE — Patient Instructions (Signed)

## 2021-01-11 NOTE — Progress Notes (Signed)
Follow-Up Visit   Subjective  Rick Scott is a 40 y.o. male who presents for the following: F/U Fixed drug eruption 2ndary to Doxycycline (Penis/glans penis, 22mf/u, cleared with Mometasone oint), Hidradenitis Suppurative (Buttocks, R axilla, f/u, could not take Doxycycline due to Fixed Drug eruptions, painful), Tinea Cruris with tinea corporis (L thigh, groin, 165m/u finished Ketoconazole 2% cr, didn't take Lamisil due to fixed drug eruption), and hair thinning.  The following portions of the chart were reviewed this encounter and updated as appropriate:   Tobacco  Allergies  Meds  Problems  Med Hx  Surg Hx  Fam Hx     Review of Systems:  No other skin or systemic complaints except as noted in HPI or Assessment and Plan.  Objective  Well appearing patient in no apparent distress; mood and affect are within normal limits.  A focused examination was performed including R axilla, buttocks, groin. Relevant physical exam findings are noted in the Assessment and Plan.  penis/glans penis clear  Right Axilla, groin 1.5cm cystic paps R axilla, R groin  Left Medial Thigh, groin, Clear today  L groin Hypopigmented patches  Right Axilla, R groin 1.5cm cystic paps  Scalp Hair thinning   Assessment & Plan  Fixed drug eruption secondary to doxycycline penis/glans penis Hx of Fixed drug eruption 2ndary to Doxycycline Resolved with Mometasone ointment  Hidradenitis suppurativa Right Axilla, groin Currently mild - Hidradenitis Suppurativa is a chronic; persistent; non-curable, but treatable condition due to abnormal inflamed sweat glands in the body folds (axilla, inframammary, groin, medial thighs), causing recurrent painful draining cysts and scarring. It can be associated with severe scarring acne and cysts; also abscesses and scarring of scalp. The goal is control and prevention of flares, as it is not curable. Scars are permanent and can be thickened. Treatment may  include daily use of topical medication and oral antibiotics.  Oral isotretinoin may also be helpful.  For more severe cases, Humira (a biologic injection) may be prescribed to decrease the inflammatory process and prevent flares.  When indicated, inflamed cysts may also be treated surgically.  Recommend Isotretinoin vs Surgical excision, Pt given CPT codes and will see if surgery is affordable and may schedule.  Start Clindamycin lotion qd/bid aa   clindamycin (CLEOCIN-T) 1 % lotion - Right Axilla, groin Apply topically as directed. Qd to bid aa cyst R axilla and R groin  Tinea cruris Left Medial Thigh, groin With Tinea Corporis Clear Related Medications terbinafine (LAMISIL) 250 MG tablet Take 1 tablet (250 mg total) by mouth daily.  ketoconazole (NIZORAL) 2 % cream Apply to aa's rash QHS.  Vitiligo genital Reviewed chronic nature, no cure and can be difficult to treat.  Vitiligo is an autoimmune condition which causes loss of skin pigment and is commonly seen on the face and may also involve areas of trauma like hands, elbows, knees, and ankles.  Treatments include topical steroids and other topical anti-inflammatory ointments/creams.  Sometimes narrow band UV light therapy or Xtrac laser is helpful, both of which require twice weekly treatments for at least 3-6 months.  Antioxidant vitamins, such as Vitamins C&E, and alpha lipoic acid may be added to enhance treatment.  Benign, observe.  Patient declines treatment. May consider Opzelura in the future  Epidermal cyst with Hidradenitis Right Axilla, R groin Discussed treatment options. Pt opts for excision.  Will schedule surgery x 2 sessions.  Telogen effluvium Scalp  Telogen effluvium is a benign, self limited condition causing increased hair  shedding usually for several months. It does not progress to baldness. It can be triggered by recent illness, recent surgery, thyroid disease, low iron stores, vitamin D deficiency, fad  diets or rapid weight loss, hormonal changes such as pregnancy or birth control pills, and some medication. Usually the hair loss starts 2-3 months after the illness or health change. Rarely, it can continue for longer than a year.  Most likely r/t recent Covid and Keto diet  Return for surgery x 2 appointments cyst R axilla, R groin.  I, [name], RMA, am acting as scribe for Sarina Ser, MD . Documentation: I have reviewed the above documentation for accuracy and completeness, and I agree with the above.  Sarina Ser, MD

## 2021-01-12 NOTE — Progress Notes (Signed)
Office Visit Note  Patient: Rick Scott             Date of Birth: 08/27/1980           MRN: [id number]             PCP: Jon Billings, NP Referring: Jon Billings, NP Visit Date: 01/26/2021 Occupation: '@GUAROCC' @  Subjective:  Pain in multiple joints.   History of Present Illness: Rick Scott is a 40 y.o. male seen in consultation per request of his PCP.  According to the patient in 2003 when he was approximately 40 years old he woke up with pain and stiffness in his neck, thoracic and lower back.  He started going to a chiropractor but his symptoms got worse over time.  He felt his neck symptoms got worse after going to the chiropractor.  At age 24 he was seen by an orthopedic surgeon who did MRI of his lumbar spine and diagnosed with a herniated lumbar disc.  He had epidural injections x4 at L5-S1 region and had a good response to it.  At age 25 he started having pain in other joints.  He states the neck and lower back pain persist but he also started having pain in bilateral trochanteric area, knee joints and his feet.  He states that IT band pain is severe enough to wake him up in the middle of the night.  He describes pain on the top of his feet.  There is no history of Achilles tendinitis or plantar fasciitis.  He has noticed possible swelling in his left knee joint in the past.  There is no history of iritis he gives history of dry eyes due to LASEK surgery.  He has had recurrent rash on his extremities and the axillary and groin region for which he has seen dermatologist and was diagnosed with a fungal infection.  He states he recently developed an abscess in the axillary region, on chart review he had hidradenitis suppurativa which was treated with doxycycline.  He developed an allergic reaction to the doxycycline.  He has been also treated for tinea cruris.  There is history of psoriasis and his mother.  There is history of lupus in maternal great aunt and rheumatoid  arthritis in maternal aunt.  He has 1 child who is healthy.  Activities of Daily Living:  Patient reports morning stiffness for all day. Patient Reports nocturnal pain.  Difficulty dressing/grooming: Reports Difficulty climbing stairs: Denies Difficulty getting out of chair: Denies Difficulty using hands for taps, buttons, cutlery, and/or writing: Denies  Review of Systems  Constitutional:  Positive for fatigue. Negative for night sweats.  HENT:  Negative for mouth sores, mouth dryness and nose dryness.   Eyes:  Positive for dryness. Negative for pain, redness and itching.  Respiratory:  Positive for shortness of breath. Negative for difficulty breathing.   Cardiovascular:  Negative for chest pain, palpitations, hypertension, irregular heartbeat and swelling in legs/feet.  Gastrointestinal:  Positive for constipation. Negative for blood in stool and diarrhea.  Endocrine: Positive for increased urination.  Genitourinary:  Negative for difficulty urinating.  Musculoskeletal:  Positive for joint pain, joint pain, myalgias, morning stiffness, muscle tenderness and myalgias. Negative for joint swelling and muscle weakness.  Skin:  Positive for rash. Negative for color change, hair loss, nodules/bumps, redness, skin tightness, ulcers and sensitivity to sunlight.  Allergic/Immunologic: Positive for susceptible to infections.  Neurological:  Positive for memory loss. Negative for dizziness, fainting, numbness, headaches, night sweats and  weakness.  Hematological:  Negative for bruising/bleeding tendency and swollen glands.  Psychiatric/Behavioral:  Negative for depressed mood, confusion and sleep disturbance. The patient is not nervous/anxious.    PMFS History:  Patient Active Problem List   Diagnosis Date Noted   OSA (obstructive sleep apnea) 07/01/2019   Asthma, moderate persistent 04/28/2019   BMI 34.0-34.9,adult 04/28/2019   Abdominal pain with radiation to back 11/12/2017   Testicular  pain, right 11/11/2017   Family history of colonic polyps 06/17/2015   Chronic diarrhea 05/18/2015   Low back pain, unspecified 05/18/2015   Joint pain 04/30/2004   Asymptomatic varicose veins of unspecified lower extremity 04/30/1998   Irritable bowel syndrome with constipation 08/29/1994   Gastric reflux 04/30/1994   Asthma due to environmental allergies 04/30/1990   Allergies 10[birth year]1978    Past Medical History:  Diagnosis Date   Allergy bananas, oranges, melons   Asthma 1996   Well controlled   Chronic diarrhea    has improved recently as diet has improved   Colon polyps    GERD (gastroesophageal reflux disease)     Family History  Problem Relation Age of Onset   COPD Mother    Diabetes Mother    Cancer Mother    Heart disease Mother    Obesity Mother    Healthy Father    Varicose Veins Father    Cancer Maternal Grandmother    Healthy Daughter    ADD / ADHD Daughter    Past Surgical History:  Procedure Laterality Date   FINGER SURGERY Right    5th digit   GANGLION CYST EXCISION     Social History   Social History Narrative   Not on file   Immunization History  Administered Date(s) Administered   Hepatitis A, Adult 06/20/2018, 12/23/2018   MMR 01/10/2018   Moderna SARS-COV2 Booster Vaccination 01/05/2021   PFIZER Comirnaty(Gray Top)Covid-19 Tri-Sucrose Vaccine 02/11/2020   PFIZER(Purple Top)SARS-COV-2 Vaccination 07/05/2019, 07/29/2019   Tdap 06/28/2015     Objective: Vital Signs: BP 128/83 (BP Location: Right Arm, Patient Position: Sitting, Cuff Size: Large)   Pulse 92   Ht 6' (1.829 m)   Wt 255 lb 3.2 oz (115.8 kg)   BMI 34.61 kg/m    Physical Exam Vitals and nursing note reviewed.  Constitutional:      Appearance: He is well-developed.  HENT:     Head: Normocephalic and atraumatic.  Eyes:     Conjunctiva/sclera: Conjunctivae normal.     Pupils: Pupils are equal, round, and reactive to light.  Cardiovascular:     Rate and Rhythm: Normal  rate and regular rhythm.     Heart sounds: Normal heart sounds.  Pulmonary:     Effort: Pulmonary effort is normal.     Breath sounds: Normal breath sounds.  Abdominal:     General: Bowel sounds are normal.     Palpations: Abdomen is soft.  Musculoskeletal:     Cervical back: Normal range of motion and neck supple.  Skin:    General: Skin is warm and dry.     Capillary Refill: Capillary refill takes less than 2 seconds.  Neurological:     Mental Status: He is alert and oriented to person, place, and time.  Psychiatric:        Behavior: Behavior normal.     Musculoskeletal Exam: C-spine was in good range of motion with some discomfort.  Thoracic spine was in good range of motion.  Lumbar spine was in good range of motion with some  discomfort.  Lumbar scoliosis was noted.  He had tenderness over bilateral SI joints.  Shoulder joints, elbow joints, wrist joints, MCPs PIPs and DIPs with good range of motion with no synovitis.  He had tenderness on palpation over left trochanteric bursa.  Hip joints with good range of motion.  Left knee joint was warm to touch and had painful range of motion.  Right knee joint was in full range of motion without any discomfort.  He had no tenderness over ankles or MTPs.  He had discomfort on the top of his bilateral feet.  He had bilateral pes cavus.  CDAI Exam: CDAI Score: -- Patient Global: --; Provider Global: -- Swollen: --; Tender: -- Joint Exam 01/26/2021   No joint exam has been documented for this visit   There is currently no information documented on the homunculus. Go to the Rheumatology activity and complete the homunculus joint exam.  Investigation: No additional findings.  Imaging: XR Cervical Spine 2 or 3 views  Result Date: 01/26/2021 No significant disc space narrowing was noted.  No facet joint arthropathy was noted. Impression: Unremarkable x-ray of the cervical spine.  XR Foot 2 Views Left  Result Date: 01/26/2021 Mild first  MTP narrowing was noted.  No intertarsal, tibiotalar or subtalar joint space narrowing was noted.  Inferior and posterior calcaneal spurs were noted. Impression: The x-rays show early osteoarthritic changes.  XR Foot 2 Views Right  Result Date: 01/26/2021 Mild first MTP narrowing was noted.  No PIP or DIP narrowing was noted.  No intertarsal, tibiotalar or subtalar joint space narrowing was noted.  Inferior and posterior calcaneal spurs were noted.  No erosive changes were noted. Impression: These findings are consistent with early osteoarthritic changes.  XR KNEE 3 VIEW LEFT  Result Date: 01/26/2021 No medial or lateral compartment narrowing was noted.  No chondrocalcinosis was noted.  Mild patellofemoral narrowing was noted. 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  XR Lumbar Spine 2-3 Views  Result Date: 01/26/2021 S shaped scoliosis was noted.  No syndesmophytes were noted.  Anterior spurring was noted.  Significant narrowing was noted between L3-L4.  Facet joint arthropathy was noted. Impression: These findings are consistent with scoliosis, degenerative changes and facet joint arthropathy.  XR Pelvis 1-2 Views  Result Date: 01/26/2021 No SI joint sclerosis or narrowing was noted. Impression: Unremarkable x-ray of the SI joints.   Recent Labs: No results found for: WBC, HGB, PLT, NA, K, CL, CO2, GLUCOSE, BUN, CREATININE, BILITOT, ALKPHOS, AST, ALT, PROT, ALBUMIN, CALCIUM, GFRAA, QFTBGOLD, QFTBGOLDPLUS  Speciality Comments: No specialty comments available.  Procedures:  No procedures performed Allergies: Avocado, Banana, Corylus, Orange oil, Doxycycline, Codeine, Hazelnut (filbert) allergy skin test, Other, and Bupropion   Assessment / Plan:     Visit Diagnoses: Polyarthralgia-patient complains of pain and discomfort in multiple joints since he was in his 59s.  Neck pain -he started having neck pain and stiffness in his 20s.  He is  started going to a chiropractor without much relief.  He states neck pain got worse over time.  He was also under care of an orthopedic surgeon few years back.  Plan: XR Cervical Spine 2 or 3 views  Chronic midline low back pain without sciatica -he had seen an orthopedic surgeon in the past due to ongoing lower back pain.  He states he had MRI of his lumbar spine which showed L5-S1 arthritis.  He had epidural injections x4 which gave him relief.  Plan:  XR Lumbar Spine 2-3 Views. X-rays showed S-shaped scoliosis, degenerative changes and facet joint arthropathy.  X-ray findings were discussed with the patient. Lumbar spine I gave him a handout on lumbar spine exercises.  Core strengthening exercises were discussed.  Patient stated that his wife is a physical therapist and he will work with her on the strengthening exercises.  Other idiopathic scoliosis, lumbar region-dextroscoliosis was noted in the lumbar spine.  Chronic left SI joint pain -he gives history of bilateral SI joint pain for many years.  Plan: XR Pelvis 1-2 Views, SI joint x-rays were unremarkable.  No SI joint sclerosis or narrowing was noted.  X-ray findings were discussed with the patient.  Trochanteric bursitis, left hip-he is sedentary lifestyle and he is not very active.  He states his left trochanteric bursa has been very painful which hurts worse when he is sleeping at night on the left side.  He also describes pain radiating into the IT band.  A handout on trochanteric bursa exercises were given.  Chronic pain of left knee -he complains of discomfort in his knee joints.  His left knee joint has been especially painful.  His left knee joint was warm to touch today.  He also had discomfort with complete flexion.  I will obtain x-rays and labs today.  A handout on lower extremity muscle strengthening exercises was given.  Plan: XR KNEE 3 VIEW LEFT, x-ray of the knee joint was unremarkable.  Lower extremity muscle strengthening exercises  were discussed.  Sedimentation rate, Rheumatoid factor, Cyclic citrul peptide antibody, IgG, Angiotensin converting enzyme, HLA-B27 antigen  Knee joint hypermobility-he has hypermobility in both of his knee joints and knee hyperextends when he stands.  Pain in both feet -he complains of discomfort in his bilateral feet.  The pain is mostly on the dorsum of his feet.  Plan: XR Foot 2 Views Right, XR Foot 2 Views Left.  X-ray of the feet showed early degenerative changes.  Pes cavus-he has bilateral pes cavus and has some callus formation under the ball of his feet.  I advised him to see a podiatrist for orthotics.  Hidradenitis suppurativa -he gives history of rash in the axillary region.  On reviewing the chart I noted that he was treated for hidradenitis suppurativa and this followed by Dr. Nehemiah Massed.  He developed an allergic reaction to doxycycline.  Tinea cruris-he had recurrent problems with tinea cruris.  He states he uses topical agents when he has a flare.  Has been followed by Dr. Nehemiah Massed.  Other fatigue - Plan: CBC with Differential/Platelet, COMPLETE METABOLIC PANEL WITH GFR, CK, Glucose 6 phosphate dehydrogenase  Irritable bowel syndrome with constipation- patient states he has had chronic constipation for many years.  He has seen a gastroenterologist.  He is also had colonoscopies every 3 years which are negative for IBD per patient.  Gastric reflux  Moderate persistent asthma without complication  Asymptomatic varicose veins of left lower extremity  Vitamin D deficiency-his vitamin D level on October 27, 2018 was 22.  OSA (obstructive sleep apnea)  Family history of colonic polyps  COVID-19 virus infection - 04/2020-patient states since he had COVID-19 infection he feels fatigue and shortness of breath.  His pulse ox stays in the 91-92 range.  His PCP is aware of his symptoms.  Family history of psoriasis in mother-his mother passed away but according to patient she had  psoriasis and was on some injectable medications.  Family history of lupus erythematosus - Maternal great aunt  Family  history of rheumatoid arthritis - Maternal aunt  Orders: Orders Placed This Encounter  Procedures   XR Cervical Spine 2 or 3 views   XR Lumbar Spine 2-3 Views   XR Pelvis 1-2 Views   XR KNEE 3 VIEW LEFT   XR Foot 2 Views Right   XR Foot 2 Views Left   CBC with Differential/Platelet   COMPLETE METABOLIC PANEL WITH GFR   CK   Sedimentation rate   Rheumatoid factor   Cyclic citrul peptide antibody, IgG   Angiotensin converting enzyme   HLA-B27 antigen   Glucose 6 phosphate dehydrogenase    No orders of the defined types were placed in this encounter.   Face-to-face time spent with patient was 70 minutes. Greater than 50% of time was spent in counseling and coordination of care.  Follow-Up Instructions: Return for Pain in multiple joints.   Bo Merino, MD  Note - This record has been created using Editor, commissioning.  Chart creation errors have been sought, but may not always  have been located. Such creation errors do not reflect on  the standard of medical care.

## 2021-01-14 ENCOUNTER — Encounter: Payer: Self-pay | Admitting: Dermatology

## 2021-01-15 ENCOUNTER — Telehealth: Payer: No Typology Code available for payment source | Admitting: Nurse Practitioner

## 2021-01-15 ENCOUNTER — Other Ambulatory Visit: Payer: Self-pay

## 2021-01-15 ENCOUNTER — Ambulatory Visit
Admission: RE | Admit: 2021-01-15 | Discharge: 2021-01-15 | Disposition: A | Discharge status: Home / Self Care | Payer: No Typology Code available for payment source | Source: Ambulatory Visit | Attending: Emergency Medicine | Admitting: Emergency Medicine

## 2021-01-15 VITALS — BP 116/82 | HR 89 | Temp 98.4°F | Resp 16 | Ht 72.0 in | Wt 258.4 lb

## 2021-01-15 DIAGNOSIS — N3090 Cystitis, unspecified without hematuria: Secondary | ICD-10-CM

## 2021-01-15 DIAGNOSIS — R399 Unspecified symptoms and signs involving the genitourinary system: Secondary | ICD-10-CM

## 2021-01-15 DIAGNOSIS — R3915 Urgency of urination: Secondary | ICD-10-CM

## 2021-01-15 LAB — POCT URINALYSIS DIP (MANUAL ENTRY)
Bilirubin, UA: NEGATIVE
Blood, UA: NEGATIVE
Glucose, UA: NEGATIVE mg/dL
Ketones, POC UA: NEGATIVE mg/dL
Leukocytes, UA: NEGATIVE
Nitrite, UA: NEGATIVE
Protein Ur, POC: NEGATIVE mg/dL
Spec Grav, UA: 1.01 (ref 1.010–1.025)
Urobilinogen, UA: 0.2 E.U./dL
pH, UA: 5 (ref 5.0–8.0)

## 2021-01-15 NOTE — Discharge Instructions (Addendum)
Take Aleve or Advil as needed.  You may use AZO or uristat over the counter to help with pain.

## 2021-01-15 NOTE — ED Provider Notes (Signed)
Subjective:    Uyless Safron is a very pleasant 40 y.o. male who presents with concerns for UTI due to urinary frequency, bladder and penile pain onset about 3 days ago.  No dysuria, fever, low back pain, perineal pain, or pelvic pain.  Past medical history, past surgical history, current medications reviewed.  Allergies: is allergic to avocado, banana, corylus, orange oil, doxycycline, codeine, hazelnut (filbert) allergy skin test, other, and bupropion.  Review of Systems See HPI   Objective:     Vitals:   01/15/21 1310  BP: 116/82  Pulse: 89  Resp: 16  Temp: 98.4 F (36.9 C)  SpO2: 98%     General: Appears well-developed and well-nourished. No acute distress.  Cardiovascular: Normal rate Pulm/Chest: No respiratory distress Neurological: Alert and oriented to person, place, and time.  Skin: Skin is warm and dry.  Psychiatric: Normal mood, affect, behavior, and thought content.  GU:  Deferred secondary to self collect specimen  Laboratory:  Orders Placed This Encounter  Procedures   POCT urinalysis dipstick   Results for orders placed or performed during the hospital encounter of 01/15/21  POCT urinalysis dipstick  Result Value Ref Range   Color, UA yellow yellow   Clarity, UA clear clear   Glucose, UA negative negative mg/dL   Bilirubin, UA negative negative   Ketones, POC UA negative negative mg/dL   Spec Grav, UA 1.010 1.010 - 1.025   Blood, UA negative negative   pH, UA 5.0 5.0 - 8.0   Protein Ur, POC negative negative mg/dL   Urobilinogen, UA 0.2 0.2 or 1.0 E.U./dL   Nitrite, UA Negative Negative   Leukocytes, UA Negative Negative     Assessment:   1. Urinary urgency - POCT urinalysis dipstick; Standing - POCT urinalysis dipstick  2. Inflammation of bladder   Plan:   MDM: Patient presents with concerns for UTI due to urinary frequency, bladder and penile pain onset about 3 days ago.  No dysuria, fever, low back pain, perineal pain, or  pelvic pain.  Urinalysis normal.  Given symptoms, likely bladder inflammation.  The patient does not have any perineal pain or fever today suggestive of prostatitis and does not report a history of prostatitis.  Did advise that he will need to follow-up with his PCP for further investigation and/or a referral to urology if needed.  Advised use of Aleve or Advil.  Also advised that he may use Azo or Uristat over-the-counter to help with discomfort.  Return as needed.  Stable on discharge.  Patient verbalized understanding and agreed with plan.    Discharge Instructions      Take Aleve or Advil as needed.  You may use AZO or uristat over the counter to help with pain.           Serafina Royals, [place] 01/15/21 1353

## 2021-01-15 NOTE — ED Triage Notes (Signed)
Pt c/o urinary frequency, bladder and penile pain. Started about 3 days ago. Denies dysuria, fever, lower back pain or pelvic pain.

## 2021-01-15 NOTE — Progress Notes (Signed)
Based on what you shared with me, I feel your condition warrants further evaluation and I recommend that you be seen in a face to face visit. This recommendation is based on your existing and new symptoms that you are experiencing. If you are able, I recommend following up with Dr. Sharrie Rothman office.   NOTE: There will be NO CHARGE for this eVisit   If you are having a true medical emergency please call 911.      For an urgent face to face visit, [facility] has six urgent care centers for your convenience:     [facility] [place] Get Driving Directions [id number] [place], [place] [address]    [facility]) Get Driving Directions [id number] [place], [place] [address]  [facility] ([place]) Get Driving Directions [id number] [place],  [place]  [address]  [facility] Get Driving Directions [id number] [address] [place], [place], [place] [address]   [facility] Get Driving Directions  [id number] [address] [place], [place] [address]   [facility] at  Get Driving Directions [id number] [address], [place],  [address]  Your MyChart E-visit questionnaire answers were reviewed by a board certified advanced clinical practitioner to complete your personal care plan based on your specific symptoms.  Thank you for using e-Visits.  I have spent at least 5 minutes reviewing and documenting in the patient's chart.

## 2021-01-26 ENCOUNTER — Ambulatory Visit: Payer: Self-pay

## 2021-01-26 ENCOUNTER — Encounter: Payer: Self-pay | Admitting: Rheumatology

## 2021-01-26 ENCOUNTER — Ambulatory Visit: Payer: No Typology Code available for payment source | Admitting: Rheumatology

## 2021-01-26 ENCOUNTER — Other Ambulatory Visit: Payer: Self-pay

## 2021-01-26 VITALS — BP 128/83 | HR 92 | Ht 72.0 in | Wt 255.2 lb

## 2021-01-26 DIAGNOSIS — M2550 Pain in unspecified joint: Secondary | ICD-10-CM

## 2021-01-26 DIAGNOSIS — Z8371 Family history of colonic polyps: Secondary | ICD-10-CM

## 2021-01-26 DIAGNOSIS — Z840 Family history of diseases of the skin and subcutaneous tissue: Secondary | ICD-10-CM

## 2021-01-26 DIAGNOSIS — M542 Cervicalgia: Secondary | ICD-10-CM | POA: Diagnosis not present

## 2021-01-26 DIAGNOSIS — M79671 Pain in right foot: Secondary | ICD-10-CM | POA: Diagnosis not present

## 2021-01-26 DIAGNOSIS — Z8261 Family history of arthritis: Secondary | ICD-10-CM

## 2021-01-26 DIAGNOSIS — M79672 Pain in left foot: Secondary | ICD-10-CM

## 2021-01-26 DIAGNOSIS — U071 COVID-19: Secondary | ICD-10-CM

## 2021-01-26 DIAGNOSIS — G8929 Other chronic pain: Secondary | ICD-10-CM

## 2021-01-26 DIAGNOSIS — L732 Hidradenitis suppurativa: Secondary | ICD-10-CM

## 2021-01-26 DIAGNOSIS — K219 Gastro-esophageal reflux disease without esophagitis: Secondary | ICD-10-CM

## 2021-01-26 DIAGNOSIS — E559 Vitamin D deficiency, unspecified: Secondary | ICD-10-CM

## 2021-01-26 DIAGNOSIS — M545 Low back pain, unspecified: Secondary | ICD-10-CM

## 2021-01-26 DIAGNOSIS — M249 Joint derangement, unspecified: Secondary | ICD-10-CM

## 2021-01-26 DIAGNOSIS — B356 Tinea cruris: Secondary | ICD-10-CM

## 2021-01-26 DIAGNOSIS — M25562 Pain in left knee: Secondary | ICD-10-CM | POA: Diagnosis not present

## 2021-01-26 DIAGNOSIS — M4126 Other idiopathic scoliosis, lumbar region: Secondary | ICD-10-CM

## 2021-01-26 DIAGNOSIS — G4733 Obstructive sleep apnea (adult) (pediatric): Secondary | ICD-10-CM

## 2021-01-26 DIAGNOSIS — M533 Sacrococcygeal disorders, not elsewhere classified: Secondary | ICD-10-CM

## 2021-01-26 DIAGNOSIS — I8392 Asymptomatic varicose veins of left lower extremity: Secondary | ICD-10-CM

## 2021-01-26 DIAGNOSIS — R5383 Other fatigue: Secondary | ICD-10-CM

## 2021-01-26 DIAGNOSIS — K581 Irritable bowel syndrome with constipation: Secondary | ICD-10-CM

## 2021-01-26 DIAGNOSIS — Q667 Congenital pes cavus, unspecified foot: Secondary | ICD-10-CM

## 2021-01-26 DIAGNOSIS — M7062 Trochanteric bursitis, left hip: Secondary | ICD-10-CM

## 2021-01-26 DIAGNOSIS — J4540 Moderate persistent asthma, uncomplicated: Secondary | ICD-10-CM

## 2021-01-26 NOTE — Patient Instructions (Signed)
Knee Exercises Ask your health care provider which exercises are safe for you. Do exercises exactly as told by your health care provider and adjust them as directed. It is normal to feel mild stretching, pulling, tightness, or discomfort as you do these exercises. Stop right away if you feel sudden pain or your pain gets worse. Do not begin these exercises until told by your health care provider. Stretching and range-of-motion exercises These exercises warm up your muscles and joints and improve the movement and flexibility of your knee. These exercises also help to relieve pain and swelling. Knee extension, prone Lie on your abdomen (prone position) on a bed. Place your left / right knee just beyond the edge of the surface so your knee is not on the bed. You can put a towel under your left / right thigh just above your kneecap for comfort. Relax your leg muscles and allow gravity to straighten your knee (extension). You should feel a stretch behind your left / right knee. Hold this position for __________ seconds. Scoot up so your knee is supported between repetitions. Repeat __________ times. Complete this exercise __________ times a day. Knee flexion, active  Lie on your back with both legs straight. If this causes back discomfort, bend your left / right knee so your foot is flat on the floor. Slowly slide your left / right heel back toward your buttocks. Stop when you feel a gentle stretch in the front of your knee or thigh (flexion). Hold this position for __________ seconds. Slowly slide your left / right heel back to the starting position. Repeat __________ times. Complete this exercise __________ times a day. Quadriceps stretch, prone  Lie on your abdomen on a firm surface, such as a bed or padded floor. Bend your left / right knee and hold your ankle. If you cannot reach your ankle or pant leg, loop a belt around your foot and grab the belt instead. Gently pull your heel toward your  buttocks. Your knee should not slide out to the side. You should feel a stretch in the front of your thigh and knee (quadriceps). Hold this position for __________ seconds. Repeat __________ times. Complete this exercise __________ times a day. Hamstring, supine Lie on your back (supine position). Loop a belt or towel over the ball of your left / right foot. The ball of your foot is on the walking surface, right under your toes. Straighten your left / right knee and slowly pull on the belt to raise your leg until you feel a gentle stretch behind your knee (hamstring). Do not let your knee bend while you do this. Keep your other leg flat on the floor. Hold this position for __________ seconds. Repeat __________ times. Complete this exercise __________ times a day. Strengthening exercises These exercises build strength and endurance in your knee. Endurance is the ability to use your muscles for a long time, even after they get tired. Quadriceps, isometric This exercise stretches the muscles in front of your thigh (quadriceps) without moving your knee joint (isometric). Lie on your back with your left / right leg extended and your other knee bent. Put a rolled towel or small pillow under your knee if told by your health care provider. Slowly tense the muscles in the front of your left / right thigh. You should see your kneecap slide up toward your hip or see increased dimpling just above the knee. This motion will push the back of the knee toward the floor. For __________   seconds, hold the muscle as tight as you can without increasing your pain. Relax the muscles slowly and completely. Repeat __________ times. Complete this exercise __________ times a day. Straight leg raises This exercise stretches the muscles in front of your thigh (quadriceps) and the muscles that move your hips (hip flexors). Lie on your back with your left / right leg extended and your other knee bent. Tense the muscles in  the front of your left / right thigh. You should see your kneecap slide up or see increased dimpling just above the knee. Your thigh may even shake a bit. Keep these muscles tight as you raise your leg 4-6 inches (10-15 cm) off the floor. Do not let your knee bend. Hold this position for __________ seconds. Keep these muscles tense as you lower your leg. Relax your muscles slowly and completely after each repetition. Repeat __________ times. Complete this exercise __________ times a day. Hamstring, isometric Lie on your back on a firm surface. Bend your left / right knee about __________ degrees. Dig your left / right heel into the surface as if you are trying to pull it toward your buttocks. Tighten the muscles in the back of your thighs (hamstring) to "dig" as hard as you can without increasing any pain. Hold this position for __________ seconds. Release the tension gradually and allow your muscles to relax completely for __________ seconds after each repetition. Repeat __________ times. Complete this exercise __________ times a day. Hamstring curls If told by your health care provider, do this exercise while wearing ankle weights. Begin with __________ lb weights. Then increase the weight by 1 lb (0.5 kg) increments. Do not wear ankle weights that are more than __________ lb. Lie on your abdomen with your legs straight. Bend your left / right knee as far as you can without feeling pain. Keep your hips flat against the floor. Hold this position for __________ seconds. Slowly lower your leg to the starting position. Repeat __________ times. Complete this exercise __________ times a day. Squats This exercise strengthens the muscles in front of your thigh and knee (quadriceps). Stand in front of a table, with your feet and knees pointing straight ahead. You may rest your hands on the table for balance but not for support. Slowly bend your knees and lower your hips like you are going to sit in a  chair. Keep your weight over your heels, not over your toes. Keep your lower legs upright so they are parallel with the table legs. Do not let your hips go lower than your knees. Do not bend lower than told by your health care provider. If your knee pain increases, do not bend as low. Hold the squat position for __________ seconds. Slowly push with your legs to return to standing. Do not use your hands to pull yourself to standing. Repeat __________ times. Complete this exercise __________ times a day. Wall slides This exercise strengthens the muscles in front of your thigh and knee (quadriceps). Lean your back against a smooth wall or door, and walk your feet out 18-24 inches (46-61 cm) from it. Place your feet hip-width apart. Slowly slide down the wall or door until your knees bend __________ degrees. Keep your knees over your heels, not over your toes. Keep your knees in line with your hips. Hold this position for __________ seconds. Repeat __________ times. Complete this exercise __________ times a day. Straight leg raises This exercise strengthens the muscles that rotate the leg at the hip and   move it away from your body (hip abductors). Lie on your side with your left / right leg in the top position. Lie so your head, shoulder, knee, and hip line up. You may bend your bottom knee to help you keep your balance. Roll your hips slightly forward so your hips are stacked directly over each other and your left / right knee is facing forward. Leading with your heel, lift your top leg 4-6 inches (10-15 cm). You should feel the muscles in your outer hip lifting. Do not let your foot drift forward. Do not let your knee roll toward the ceiling. Hold this position for __________ seconds. Slowly return your leg to the starting position. Let your muscles relax completely after each repetition. Repeat __________ times. Complete this exercise __________ times a day. Straight leg raises This  exercise stretches the muscles that move your hips away from the front of the pelvis (hip extensors). Lie on your abdomen on a firm surface. You can put a pillow under your hips if that is more comfortable. Tense the muscles in your buttocks and lift your left / right leg about 4-6 inches (10-15 cm). Keep your knee straight as you lift your leg. Hold this position for __________ seconds. Slowly lower your leg to the starting position. Let your leg relax completely after each repetition. Repeat __________ times. Complete this exercise __________ times a day. This information is not intended to replace advice given to you by your health care provider. Make sure you discuss any questions you have with your health care provider. Document Revised: 02/04/2018 Document Reviewed: 02/04/2018 [employer] Patient Education  2022 [place] [facility] Ask your health care provider which exercises are safe for you. Do exercises exactly as told by your health care provider and adjust them as directed. It is normal to feel mild stretching, pulling, tightness, or discomfort as you do these exercises. Stop right away if you feel sudden pain or your pain gets significantly worse. Do not begin these exercises until told by your health care provider. Stretching and range-of-motion exercises These exercises warm up your muscles and joints and improve the movement and flexibility of your hip and pelvis. Quadriceps stretch, prone  Lie on your abdomen (prone position) on a firm surface, such as a bed or padded floor. Bend your left / right knee and reach back to hold your ankle or pant leg. If you cannot reach your ankle or pant leg, loop a belt around your foot and grab the belt instead. Gently pull your heel toward your buttocks. Your knee should not slide out to the side. You should feel a stretch in the front of your thigh and knee (quadriceps). Hold this position for __________ seconds. Repeat  __________ times. Complete this exercise __________ times a day. Iliotibial band stretch An iliotibial band is a strong band of muscle tissue that runs from the outer side of your hip to the outer side of your thigh and knee. Lie on your side with your left / right leg in the top position. Bend both of your knees and grab your left / right ankle. Stretch out your bottom arm to help you balance. Slowly bring your top knee back so your thigh goes behind your trunk. Slowly lower your top leg toward the floor until you feel a gentle stretch on the outside of your left / right hip and thigh. If you do not feel a stretch and your knee will not fall farther, place the  heel of your other foot on top of your knee and pull your knee down toward the floor with your foot. Hold this position for __________ seconds. Repeat __________ times. Complete this exercise __________ times a day. Strengthening exercises These exercises build strength and endurance in your hip and pelvis. Endurance is the ability to use your muscles for a long time, even after they get tired. Straight leg raises, side-lying This exercise strengthens the muscles that rotate the leg at the hip and move it away from your body (hip abductors). Lie on your side with your left / right leg in the top position. Lie so your head, shoulder, hip, and knee line up. You may bend your bottom knee to help you balance. Roll your hips slightly forward so your hips are stacked directly over each other and your left / right knee is facing forward. Tense the muscles in your outer thigh and lift your top leg 4-6 inches (10-15 cm). Hold this position for __________ seconds. Slowly lower your leg to return to the starting position. Let your muscles relax completely before doing another repetition. Repeat __________ times. Complete this exercise __________ times a day. Leg raises, prone This exercise strengthens the muscles that move the hips backward (hip  extensors). Lie on your abdomen (prone position) on your bed or a firm surface. You can put a pillow under your hips if that is more comfortable for your lower back. Bend your left / right knee so your foot is straight up in the air. Squeeze your buttocks muscles and lift your left / right thigh off the bed. Do not let your back arch. Tense your thigh muscle as hard as you can without increasing any knee pain. Hold this position for __________ seconds. Slowly lower your leg to return to the starting position and allow it to relax completely. Repeat __________ times. Complete this exercise __________ times a day. Hip hike Stand sideways on a bottom step. Stand on your left / right leg with your other foot unsupported next to the step. You can hold on to a railing or wall for balance if needed. Keep your knees straight and your torso square. Then lift your left / right hip up toward the ceiling. Slowly let your left / right hip lower toward the floor, past the starting position. Your foot should get closer to the floor. Do not lean or bend your knees. Repeat __________ times. Complete this exercise __________ times a day. This information is not intended to replace advice given to you by your health care provider. Make sure you discuss any questions you have with your health care provider. Document Revised: 06/24/2019 Document Reviewed: 06/24/2019 Else[employer] Patient Education  [place]. Back Exercises The following exercises strengthen the muscles that help to support the trunk (torso) and back. They also help to keep the lower back flexible. Doing these exercises can help to prevent or lessen existing low back pain. If you have back pain or discomfort, try doing these exercises 2-3 times each day or as told by your health care provider. As your pain improves, do them once each day, but increase the number of times that you repeat the steps for each exercise (do more repetitions). To  prevent the recurrence of back pain, continue to do these exercises once each day or as told by your health care provider. Do exercises exactly as told by your health care provider and adjust them as directed. It is normal to feel mild stretching, pulling,  tightness, or discomfort as you do these exercises, but you should stop right away if you feel sudden pain or your pain gets worse. Exercises Single knee to chest Repeat these steps 3-5 times for each leg: Lie on your back on a firm bed or the floor with your legs extended. Bring one knee to your chest. Your other leg should stay extended and in contact with the floor. Hold your knee in place by grabbing your knee or thigh with both hands and hold. Pull on your knee until you feel a gentle stretch in your lower back or buttocks. Hold the stretch for 10-30 seconds. Slowly release and straighten your leg. Pelvic tilt Repeat these steps 5-10 times: Lie on your back on a firm bed or the floor with your legs extended. Bend your knees so they are pointing toward the ceiling and your feet are flat on the floor. Tighten your lower abdominal muscles to press your lower back against the floor. This motion will tilt your pelvis so your tailbone points up toward the ceiling instead of pointing to your feet or the floor. With gentle tension and even breathing, hold this position for 5-10 seconds. Cat-cow Repeat these steps until your lower back becomes more flexible: Get into a hands-and-knees position on a firm bed or the floor. Keep your hands under your shoulders, and keep your knees under your hips. You may place padding under your knees for comfort. Let your head hang down toward your chest. Contract your abdominal muscles and point your tailbone toward the floor so your lower back becomes rounded like the back of a cat. Hold this position for 5 seconds. Slowly lift your head, let your abdominal muscles relax, and point your tailbone up toward the  ceiling so your back forms a sagging arch like the back of a cow. Hold this position for 5 seconds.  Press-ups Repeat these steps 5-10 times: Lie on your abdomen (face-down) on a firm bed or the floor. Place your palms near your head, about shoulder-width apart. Keeping your back as relaxed as possible and keeping your hips on the floor, slowly straighten your arms to raise the top half of your body and lift your shoulders. Do not use your back muscles to raise your upper torso. You may adjust the placement of your hands to make yourself more comfortable. Hold this position for 5 seconds while you keep your back relaxed. Slowly return to lying flat on the floor.  Bridges Repeat these steps 10 times: Lie on your back on a firm bed or the floor. Bend your knees so they are pointing toward the ceiling and your feet are flat on the floor. Your arms should be flat at your sides, next to your body. Tighten your buttocks muscles and lift your buttocks off the floor until your waist is at almost the same height as your knees. You should feel the muscles working in your buttocks and the back of your thighs. If you do not feel these muscles, slide your feet 1-2 inches (2.5-5 cm) farther away from your buttocks. Hold this position for 3-5 seconds. Slowly lower your hips to the starting position, and allow your buttocks muscles to relax completely. If this exercise is too easy, try doing it with your arms crossed over your chest. Abdominal crunches Repeat these steps 5-10 times: Lie on your back on a firm bed or the floor with your legs extended. Bend your knees so they are pointing toward the ceiling and your  feet are flat on the floor. Cross your arms over your chest. Tip your chin slightly toward your chest without bending your neck. Tighten your abdominal muscles and slowly raise your torso high enough to lift your shoulder blades a tiny bit off the floor. Avoid raising your torso higher than that  because it can put too much stress on your lower back and does not help to strengthen your abdominal muscles. Slowly return to your starting position. Back lifts Repeat these steps 5-10 times: Lie on your abdomen (face-down) with your arms at your sides, and rest your forehead on the floor. Tighten the muscles in your legs and your buttocks. Slowly lift your chest off the floor while you keep your hips pressed to the floor. Keep the back of your head in line with the curve in your back. Your eyes should be looking at the floor. Hold this position for 3-5 seconds. Slowly return to your starting position. Contact a health care provider if: Your back pain or discomfort gets much worse when you do an exercise. Your worsening back pain or discomfort does not lessen within 2 hours after you exercise. If you have any of these problems, stop doing these exercises right away. Do not do them again unless your health care provider says that you can. Get help right away if: You develop sudden, severe back pain. If this happens, stop doing the exercises right away. Do not do them again unless your health care provider says that you can. This information is not intended to replace advice given to you by your health care provider. Make sure you discuss any questions you have with your health care provider. Document Revised: 06/29/2020 Document Reviewed: 06/29/2020 Elsevier Patient Education  [place].

## 2021-01-31 ENCOUNTER — Encounter: Payer: Self-pay | Admitting: Dermatology

## 2021-01-31 LAB — CBC WITH DIFFERENTIAL/PLATELET
Absolute Monocytes: 516 cells/uL (ref 200–950)
Basophils Absolute: 67 cells/uL (ref 0–200)
Basophils Relative: 1 %
Eosinophils Absolute: 489 cells/uL (ref 15–500)
Eosinophils Relative: 7.3 %
HCT: 47.3 % (ref 38.5–50.0)
Hemoglobin: 15.9 g/dL (ref 13.2–17.1)
Lymphs Abs: 1796 cells/uL (ref 850–3900)
MCH: 30.4 pg (ref 27.0–33.0)
MCHC: 33.6 g/dL (ref 32.0–36.0)
MCV: 90.4 fL (ref 80.0–100.0)
MPV: 10.9 fL (ref 7.5–12.5)
Monocytes Relative: 7.7 %
Neutro Abs: 3832 cells/uL (ref 1500–7800)
Neutrophils Relative %: 57.2 %
Platelets: 224 10*3/uL (ref 140–400)
RBC: 5.23 10*6/uL (ref 4.20–5.80)
RDW: 14.4 % (ref 11.0–15.0)
Total Lymphocyte: 26.8 %
WBC: 6.7 10*3/uL (ref 3.8–10.8)

## 2021-01-31 LAB — COMPLETE METABOLIC PANEL WITH GFR
AG Ratio: 1.7 (calc) (ref 1.0–2.5)
ALT: 32 U/L (ref 9–46)
AST: 32 U/L (ref 10–40)
Albumin: 4.8 g/dL (ref 3.6–5.1)
Alkaline phosphatase (APISO): 101 U/L (ref 36–130)
BUN: 19 mg/dL (ref 7–25)
CO2: 27 mmol/L (ref 20–32)
Calcium: 9.8 mg/dL (ref 8.6–10.3)
Chloride: 105 mmol/L (ref 98–110)
Creat: 1.01 mg/dL (ref 0.60–1.29)
Globulin: 2.9 g/dL (calc) (ref 1.9–3.7)
Glucose, Bld: 98 mg/dL (ref 65–99)
Potassium: 4.2 mmol/L (ref 3.5–5.3)
Sodium: 142 mmol/L (ref 135–146)
Total Bilirubin: 0.4 mg/dL (ref 0.2–1.2)
Total Protein: 7.7 g/dL (ref 6.1–8.1)
eGFR: 95 mL/min/{1.73_m2} (ref >=60)

## 2021-01-31 LAB — CYCLIC CITRUL PEPTIDE ANTIBODY, IGG: Cyclic Citrullin Peptide Ab: <16 UNITS

## 2021-01-31 LAB — CK: Total CK: 212 U/L — ABNORMAL HIGH (ref 44–196)

## 2021-01-31 LAB — HLA-B27 ANTIGEN: HLA-B27 Antigen: NEGATIVE

## 2021-01-31 LAB — RHEUMATOID FACTOR: Rheumatoid fact SerPl-aCnc: <14 IU/mL (ref <14)

## 2021-01-31 LAB — ANGIOTENSIN CONVERTING ENZYME: Angiotensin-Converting Enzyme: 32 U/L (ref 9–67)

## 2021-01-31 LAB — GLUCOSE 6 PHOSPHATE DEHYDROGENASE: G-6PDH: 13.1 U/g Hgb (ref 7.0–20.5)

## 2021-01-31 LAB — SEDIMENTATION RATE: Sed Rate: 2 mm/h (ref 0–15)

## 2021-01-31 NOTE — Progress Notes (Signed)
I will discuss results at the follow-up visit.

## 2021-02-08 NOTE — Progress Notes (Signed)
Office Visit Note  Patient: Rick Scott             Date of Birth: 05-14-1980           MRN: [id number]             PCP: Jon Billings, NP Referring: Jon Billings, NP Visit Date: 02/22/2021 Occupation: '@GUAROCC' @  Subjective:  Pain in multiple joints.   History of Present Illness: Rick Scott is a 40 y.o. male with history of pain in multiple joints.  He states he continues to have pain and discomfort in his left hip which she describes over the left trochanteric region.  He also has popping and discomfort in his left knee joint.  He continues to have pain in his entire spine.  He states he has been doing some exercises recommended by his wife was no physical therapist.  He has been doing IT band stretches and knee joint muscle strengthening exercises.  He has not noticed any joint swelling.  He has noticed some new skin lesions for which she was seen by dermatologist and was diagnosed with tinea corporis and tinea cruris.  He states when he was seen at [facility] few months back his CK was elevated.  He has not experienced any muscle weakness but notices discomfort in his left thigh muscle.    Activities of Daily Living:  Patient reports morning stiffness for all day. Patient Reports nocturnal pain.  Difficulty dressing/grooming: Reports Difficulty climbing stairs: Denies Difficulty getting out of chair: Denies Difficulty using hands for taps, buttons, cutlery, and/or writing: Denies  Review of Systems  Constitutional:  Positive for fatigue.  HENT:  Positive for mouth dryness. Negative for mouth sores and nose dryness.   Eyes:  Positive for dryness. Negative for pain and itching.  Respiratory:  Negative for shortness of breath and difficulty breathing.   Cardiovascular:  Negative for chest pain and palpitations.  Gastrointestinal:  Positive for constipation. Negative for blood in stool and diarrhea.  Endocrine: Positive for increased urination.  Genitourinary:   Negative for difficulty urinating and painful urination.  Musculoskeletal:  Positive for joint pain, joint pain, myalgias, morning stiffness, muscle tenderness and myalgias. Negative for joint swelling.  Skin:  Positive for rash. Negative for color change.  Allergic/Immunologic: Negative for susceptible to infections.  Neurological:  Positive for memory loss. Negative for dizziness, numbness, headaches and weakness.  Hematological:  Negative for bruising/bleeding tendency.  Psychiatric/Behavioral:  Negative for confusion.    PMFS History:  Patient Active Problem List   Diagnosis Date Noted   OSA (obstructive sleep apnea) 07/01/2019   Asthma, moderate persistent 04/28/2019   BMI 34.0-34.9,adult 04/28/2019   Abdominal pain with radiation to back 11/12/2017   Testicular pain, right 11/11/2017   Family history of colonic polyps 06/17/2015   Chronic diarrhea 05/18/2015   Low back pain, unspecified 05/18/2015   Joint pain 04/30/2004   Asymptomatic varicose veins of unspecified lower extremity 04/30/1998   Irritable bowel syndrome with constipation 08/29/1994   Gastric reflux 04/30/1994   Asthma due to environmental allergies 04/30/1990   Allergies 08-13-1978    Past Medical History:  Diagnosis Date   Allergy bananas, oranges, melons   Asthma 1996   Well controlled   Chronic diarrhea    has improved recently as diet has improved   Colon polyps    GERD (gastroesophageal reflux disease)     Family History  Problem Relation Age of Onset   COPD Mother    Diabetes Mother  Cancer Mother    Heart disease Mother    Obesity Mother    Healthy Father    Varicose Veins Father    Cancer Maternal Grandmother    Healthy Daughter    ADD / ADHD Daughter    Past Surgical History:  Procedure Laterality Date   FINGER SURGERY Right    5th digit   GANGLION CYST EXCISION     Social History   Social History Narrative   Not on file   Immunization History  Administered Date(s)  Administered   Hepatitis A, Adult 06/20/2018, 12/23/2018   MMR 01/10/2018   Moderna SARS-COV2 Booster Vaccination 01/05/2021   PFIZER Comirnaty(Gray Top)Covid-19 Tri-Sucrose Vaccine 02/11/2020   PFIZER(Purple Top)SARS-COV-2 Vaccination 07/05/2019, 07/29/2019   Tdap 06/28/2015     Objective: Vital Signs: BP 112/78 (BP Location: Left Arm, Patient Position: Sitting, Cuff Size: Normal)   Pulse 74   Ht 6' (1.829 m)   Wt 257 lb 3.2 oz (116.7 kg)   BMI 34.88 kg/m    Physical Exam Vitals and nursing note reviewed.  Constitutional:      Appearance: He is well-developed.  HENT:     Head: Normocephalic and atraumatic.  Eyes:     Conjunctiva/sclera: Conjunctivae normal.     Pupils: Pupils are equal, round, and reactive to light.  Cardiovascular:     Rate and Rhythm: Normal rate and regular rhythm.     Heart sounds: Normal heart sounds.  Pulmonary:     Effort: Pulmonary effort is normal.     Breath sounds: Normal breath sounds.  Abdominal:     General: Bowel sounds are normal.     Palpations: Abdomen is soft.  Musculoskeletal:     Cervical back: Normal range of motion and neck supple.  Skin:    General: Skin is warm and dry.     Capillary Refill: Capillary refill takes less than 2 seconds.     Findings: Rash present.  Neurological:     Mental Status: He is alert and oriented to person, place, and time.  Psychiatric:        Behavior: Behavior normal.     Musculoskeletal Exam: C-spine was in good range of motion.  Good range of motion of thoracic and lumbar spine.  With some discomfort range of motion of his lumbar spine.  Tenderness over left SI joint.  Shoulder joints, elbow joints, wrist joints, MCPs PIPs and DIPs with good range of motion with no synovitis.  He had tenderness on palpation over left trochanteric bursa.  He had crepitus in his left knee joint.  No warmth swelling or effusion was noted.  There was no tenderness over ankles or MTPs.  CDAI Exam: CDAI Score:  -- Patient Global: --; Provider Global: -- Swollen: --; Tender: -- Joint Exam 02/22/2021   No joint exam has been documented for this visit   There is currently no information documented on the homunculus. Go to the Rheumatology activity and complete the homunculus joint exam.  Investigation: No additional findings.  Imaging: XR Cervical Spine 2 or 3 views  Result Date: 01/26/2021 No significant disc space narrowing was noted.  No facet joint arthropathy was noted. Impression: Unremarkable x-ray of the cervical spine.  XR Foot 2 Views Left  Result Date: 01/26/2021 Mild first MTP narrowing was noted.  No intertarsal, tibiotalar or subtalar joint space narrowing was noted.  Inferior and posterior calcaneal spurs were noted. Impression: The x-rays show early osteoarthritic changes.  XR Foot 2 Views Right  Result  Date: 01/26/2021 Mild first MTP narrowing was noted.  No PIP or DIP narrowing was noted.  No intertarsal, tibiotalar or subtalar joint space narrowing was noted.  Inferior and posterior calcaneal spurs were noted.  No erosive changes were noted. Impression: These findings are consistent with early osteoarthritic changes.  XR KNEE 3 VIEW LEFT  Result Date: 01/26/2021 No medial or lateral compartment narrowing was noted.  No chondrocalcinosis was noted.  Mild patellofemoral narrowing was noted. 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  XR Lumbar Spine 2-3 Views  Result Date: 01/26/2021 S shaped scoliosis was noted.  No syndesmophytes were noted.  Anterior spurring was noted.  Significant narrowing was noted between L3-L4.  Facet joint arthropathy was noted. Impression: These findings are consistent with scoliosis, degenerative changes and facet joint arthropathy.  XR Pelvis 1-2 Views  Result Date: 01/26/2021 No SI joint sclerosis or narrowing was noted. Impression: Unremarkable x-ray of the SI joints.   Recent Labs: Lab Results   Component Value Date   WBC 6.7 01/26/2021   HGB 15.9 01/26/2021   PLT 224 01/26/2021   NA 142 01/26/2021   K 4.2 01/26/2021   CL 105 01/26/2021   CO2 27 01/26/2021   GLUCOSE 98 01/26/2021   BUN 19 01/26/2021   CREATININE 1.01 01/26/2021   BILITOT 0.4 01/26/2021   AST 32 01/26/2021   ALT 32 01/26/2021   PROT 7.7 01/26/2021   CALCIUM 9.8 01/26/2021   January 26, 2021 TG256,  sed rate 2, RF negative, anti-CCP negative, ACE 32, HLA-B27 negative, G6PD normal  Sep 12, 2020 CK 303 at [place]: No specialty comments available.  Procedures:  No procedures performed Allergies: Avocado, Banana, Corylus, Orange oil, Doxycycline, Codeine, Hazelnut (filbert) allergy skin test, Other, and Bupropion   Assessment / Plan:     Visit Diagnoses: Elevated CK -CK is mildly elevated.  I also reviewed his CT from [place] done in May 2022 which was 303.  He denies statin use.  I will obtain following labs today.  Plan: Aldolase, Myositis Assessr Plus Jo-1 Autoabs, CK, Anti-HMGCR Ab IgG, TSH, ANA  Trochanteric bursitis, left hip-he continues to have discomfort over left trochanteric region.  He had tenderness on palpation.  He tenderness across the left IT band.  He declined physical therapy as his wife is a physical therapist and has been doing exercises at home.  A handout on exercises was placed in the AVS.  We discussed future cortisone injection if he has persistent symptoms.  Primary osteoarthritis of left knee - Mild osteoarthritis and chondromalacia patella was noted.  He had crepitus in his left knee joint and discomfort in the patellofemoral region.  Muscle strengthening exercises were emphasized.  A handout was placed in the AVS.  Primary osteoarthritis of both feet-he has discomfort in his bilateral feet.  X-rays were consistent with early osteoarthritic changes.  X-ray findings were discussed with patient.  Pes cavus - Callus formation was noted.  Orthotics were  advised.  Neck pain - History of neck pain since he was in his 42s.  X-ray of the cervical spine were unmarkable.  Other idiopathic scoliosis, lumbar region  DDD (degenerative disc disease), lumbar - S-shaped scoliosis, multilevel spondylosis and facet joint arthropathy was noted.  Core strengthening exercises were discussed and a handout was placed in the AVS.  Chronic left SI joint pain - History of chronic SI joint pain for many years.  X-rays were unremarkable.  Hidradenitis suppurativa -  Treated in the past.  He denies any recurrence.  Tinea cruris - History of recurrent tinea cruris.  Treated with topical agents by Dr. Nehemiah Massed.  He had some new lesions on his extremities and was diagnosed with tinea corporis.  Irritable bowel syndrome with constipation - History of colonoscopies every 3 years which has been negative for IBD per patient.  Gastric reflux  Moderate persistent asthma without complication  Asymptomatic varicose veins of left lower extremity  Vitamin D deficiency  OSA (obstructive sleep apnea)  Family history of colonic polyps  Family history of psoriasis in mother  Family history of lupus erythematosus - Maternal great aunt  Family history of rheumatoid arthritis - Maternal aunt  Orders: Orders Placed This Encounter  Procedures   Aldolase   Myositis Assessr Plus Jo-1 Autoabs   CK   Anti-HMGCR Ab IgG   TSH   ANA    No orders of the defined types were placed in this encounter.    Follow-Up Instructions: Return in about 2 months (around 04/24/2021) for elevated CK, OA.   Bo Merino, MD  Note - This record has been created using Editor, commissioning.  Chart creation errors have been sought, but may not always  have been located. Such creation errors do not reflect on  the standard of medical care.

## 2021-02-22 ENCOUNTER — Ambulatory Visit: Payer: 59 | Admitting: Rheumatology

## 2021-02-22 ENCOUNTER — Encounter: Payer: Self-pay | Admitting: Rheumatology

## 2021-02-22 ENCOUNTER — Other Ambulatory Visit: Payer: Self-pay

## 2021-02-22 VITALS — BP 112/78 | HR 74 | Ht 72.0 in | Wt 257.2 lb

## 2021-02-22 DIAGNOSIS — Z8371 Family history of colonic polyps: Secondary | ICD-10-CM

## 2021-02-22 DIAGNOSIS — Z840 Family history of diseases of the skin and subcutaneous tissue: Secondary | ICD-10-CM

## 2021-02-22 DIAGNOSIS — M542 Cervicalgia: Secondary | ICD-10-CM

## 2021-02-22 DIAGNOSIS — M5136 Other intervertebral disc degeneration, lumbar region: Secondary | ICD-10-CM

## 2021-02-22 DIAGNOSIS — Q667 Congenital pes cavus, unspecified foot: Secondary | ICD-10-CM

## 2021-02-22 DIAGNOSIS — Z8261 Family history of arthritis: Secondary | ICD-10-CM

## 2021-02-22 DIAGNOSIS — I8392 Asymptomatic varicose veins of left lower extremity: Secondary | ICD-10-CM

## 2021-02-22 DIAGNOSIS — M7062 Trochanteric bursitis, left hip: Secondary | ICD-10-CM | POA: Diagnosis not present

## 2021-02-22 DIAGNOSIS — B356 Tinea cruris: Secondary | ICD-10-CM

## 2021-02-22 DIAGNOSIS — K581 Irritable bowel syndrome with constipation: Secondary | ICD-10-CM

## 2021-02-22 DIAGNOSIS — R748 Abnormal levels of other serum enzymes: Secondary | ICD-10-CM

## 2021-02-22 DIAGNOSIS — M1712 Unilateral primary osteoarthritis, left knee: Secondary | ICD-10-CM

## 2021-02-22 DIAGNOSIS — M533 Sacrococcygeal disorders, not elsewhere classified: Secondary | ICD-10-CM

## 2021-02-22 DIAGNOSIS — L732 Hidradenitis suppurativa: Secondary | ICD-10-CM

## 2021-02-22 DIAGNOSIS — M19072 Primary osteoarthritis, left ankle and foot: Secondary | ICD-10-CM

## 2021-02-22 DIAGNOSIS — G8929 Other chronic pain: Secondary | ICD-10-CM

## 2021-02-22 DIAGNOSIS — M4126 Other idiopathic scoliosis, lumbar region: Secondary | ICD-10-CM

## 2021-02-22 DIAGNOSIS — K219 Gastro-esophageal reflux disease without esophagitis: Secondary | ICD-10-CM

## 2021-02-22 DIAGNOSIS — M19071 Primary osteoarthritis, right ankle and foot: Secondary | ICD-10-CM | POA: Diagnosis not present

## 2021-02-22 DIAGNOSIS — G4733 Obstructive sleep apnea (adult) (pediatric): Secondary | ICD-10-CM

## 2021-02-22 DIAGNOSIS — J4540 Moderate persistent asthma, uncomplicated: Secondary | ICD-10-CM

## 2021-02-22 DIAGNOSIS — E559 Vitamin D deficiency, unspecified: Secondary | ICD-10-CM

## 2021-02-22 NOTE — Patient Instructions (Signed)
Back Exercises The following exercises strengthen the muscles that help to support the trunk (torso) and back. They also help to keep the lower back flexible. Doing these exercises can help to prevent or lessen existing low back pain. If you have back pain or discomfort, try doing these exercises 2-3 times each day or as told by your health care provider. As your pain improves, do them once each day, but increase the number of times that you repeat the steps for each exercise (do more repetitions). To prevent the recurrence of back pain, continue to do these exercises once each day or as told by your health care provider. Do exercises exactly as told by your health care provider and adjust them as directed. It is normal to feel mild stretching, pulling, tightness, or discomfort as you do these exercises, but you should stop right away if you feel sudden pain or your pain gets worse. Exercises Single knee to chest Repeat these steps 3-5 times for each leg: Lie on your back on a firm bed or the floor with your legs extended. Bring one knee to your chest. Your other leg should stay extended and in contact with the floor. Hold your knee in place by grabbing your knee or thigh with both hands and hold. Pull on your knee until you feel a gentle stretch in your lower back or buttocks. Hold the stretch for 10-30 seconds. Slowly release and straighten your leg. Pelvic tilt Repeat these steps 5-10 times: Lie on your back on a firm bed or the floor with your legs extended. Bend your knees so they are pointing toward the ceiling and your feet are flat on the floor. Tighten your lower abdominal muscles to press your lower back against the floor. This motion will tilt your pelvis so your tailbone points up toward the ceiling instead of pointing to your feet or the floor. With gentle tension and even breathing, hold this position for 5-10 seconds. Cat-cow Repeat these steps until your lower back becomes more  flexible: Get into a hands-and-knees position on a firm bed or the floor. Keep your hands under your shoulders, and keep your knees under your hips. You may place padding under your knees for comfort. Let your head hang down toward your chest. Contract your abdominal muscles and point your tailbone toward the floor so your lower back becomes rounded like the back of a cat. Hold this position for 5 seconds. Slowly lift your head, let your abdominal muscles relax, and point your tailbone up toward the ceiling so your back forms a sagging arch like the back of a cow. Hold this position for 5 seconds.  Press-ups Repeat these steps 5-10 times: Lie on your abdomen (face-down) on a firm bed or the floor. Place your palms near your head, about shoulder-width apart. Keeping your back as relaxed as possible and keeping your hips on the floor, slowly straighten your arms to raise the top half of your body and lift your shoulders. Do not use your back muscles to raise your upper torso. You may adjust the placement of your hands to make yourself more comfortable. Hold this position for 5 seconds while you keep your back relaxed. Slowly return to lying flat on the floor.  Bridges Repeat these steps 10 times: Lie on your back on a firm bed or the floor. Bend your knees so they are pointing toward the ceiling and your feet are flat on the floor. Your arms should be flat at your   sides, next to your body. Tighten your buttocks muscles and lift your buttocks off the floor until your waist is at almost the same height as your knees. You should feel the muscles working in your buttocks and the back of your thighs. If you do not feel these muscles, slide your feet 1-2 inches (2.5-5 cm) farther away from your buttocks. Hold this position for 3-5 seconds. Slowly lower your hips to the starting position, and allow your buttocks muscles to relax completely. If this exercise is too easy, try doing it with your arms  crossed over your chest. Abdominal crunches Repeat these steps 5-10 times: Lie on your back on a firm bed or the floor with your legs extended. Bend your knees so they are pointing toward the ceiling and your feet are flat on the floor. Cross your arms over your chest. Tip your chin slightly toward your chest without bending your neck. Tighten your abdominal muscles and slowly raise your torso high enough to lift your shoulder blades a tiny bit off the floor. Avoid raising your torso higher than that because it can put too much stress on your lower back and does not help to strengthen your abdominal muscles. Slowly return to your starting position. Back lifts Repeat these steps 5-10 times: Lie on your abdomen (face-down) with your arms at your sides, and rest your forehead on the floor. Tighten the muscles in your legs and your buttocks. Slowly lift your chest off the floor while you keep your hips pressed to the floor. Keep the back of your head in line with the curve in your back. Your eyes should be looking at the floor. Hold this position for 3-5 seconds. Slowly return to your starting position. Contact a health care provider if: Your back pain or discomfort gets much worse when you do an exercise. Your worsening back pain or discomfort does not lessen within 2 hours after you exercise. If you have any of these problems, stop doing these exercises right away. Do not do them again unless your health care provider says that you can. Get help right away if: You develop sudden, severe back pain. If this happens, stop doing the exercises right away. Do not do them again unless your health care provider says that you can. This information is not intended to replace advice given to you by your health care provider. Make sure you discuss any questions you have with your health care provider. Document Revised: 06/29/2020 Document Reviewed: 06/29/2020 [employer] Patient Education  [place]. Knee Exercises Ask your health care provider which exercises are safe for you. Do exercises exactly as told by your health care provider and adjust them as directed. It is normal to feel mild stretching, pulling, tightness, or discomfort as you do these exercises. Stop right away if you feel sudden pain or your pain gets worse. Do not begin these exercises until told by your health care provider. Stretching and range-of-motion exercises These exercises warm up your muscles and joints and improve the movement and flexibility of your knee. These exercises also help to relieve pain and swelling. Knee extension, prone Lie on your abdomen (prone position) on a bed. Place your left / right knee just beyond the edge of the surface so your knee is not on the bed. You can put a towel under your left / right thigh just above your kneecap for comfort. Relax your leg muscles and allow gravity to straighten your knee (extension). You should feel a  stretch behind your left / right knee. Hold this position for __________ seconds. Scoot up so your knee is supported between repetitions. Repeat __________ times. Complete this exercise __________ times a day. Knee flexion, active  Lie on your back with both legs straight. If this causes back discomfort, bend your left / right knee so your foot is flat on the floor. Slowly slide your left / right heel back toward your buttocks. Stop when you feel a gentle stretch in the front of your knee or thigh (flexion). Hold this position for __________ seconds. Slowly slide your left / right heel back to the starting position. Repeat __________ times. Complete this exercise __________ times a day. Quadriceps stretch, prone  Lie on your abdomen on a firm surface, such as a bed or padded floor. Bend your left / right knee and hold your ankle. If you cannot reach your ankle or pant leg, loop a belt around your foot and grab the belt instead. Gently pull your heel toward  your buttocks. Your knee should not slide out to the side. You should feel a stretch in the front of your thigh and knee (quadriceps). Hold this position for __________ seconds. Repeat __________ times. Complete this exercise __________ times a day. Hamstring, supine Lie on your back (supine position). Loop a belt or towel over the ball of your left / right foot. The ball of your foot is on the walking surface, right under your toes. Straighten your left / right knee and slowly pull on the belt to raise your leg until you feel a gentle stretch behind your knee (hamstring). Do not let your knee bend while you do this. Keep your other leg flat on the floor. Hold this position for __________ seconds. Repeat __________ times. Complete this exercise __________ times a day. Strengthening exercises These exercises build strength and endurance in your knee. Endurance is the ability to use your muscles for a long time, even after they get tired. Quadriceps, isometric This exercise stretches the muscles in front of your thigh (quadriceps) without moving your knee joint (isometric). Lie on your back with your left / right leg extended and your other knee bent. Put a rolled towel or small pillow under your knee if told by your health care provider. Slowly tense the muscles in the front of your left / right thigh. You should see your kneecap slide up toward your hip or see increased dimpling just above the knee. This motion will push the back of the knee toward the floor. For __________ seconds, hold the muscle as tight as you can without increasing your pain. Relax the muscles slowly and completely. Repeat __________ times. Complete this exercise __________ times a day. Straight leg raises This exercise stretches the muscles in front of your thigh (quadriceps) and the muscles that move your hips (hip flexors). Lie on your back with your left / right leg extended and your other knee bent. Tense the muscles  in the front of your left / right thigh. You should see your kneecap slide up or see increased dimpling just above the knee. Your thigh may even shake a bit. Keep these muscles tight as you raise your leg 4-6 inches (10-15 cm) off the floor. Do not let your knee bend. Hold this position for __________ seconds. Keep these muscles tense as you lower your leg. Relax your muscles slowly and completely after each repetition. Repeat __________ times. Complete this exercise __________ times a day. Hamstring, isometric Lie on your back  on a firm surface. Bend your left / right knee about __________ degrees. Dig your left / right heel into the surface as if you are trying to pull it toward your buttocks. Tighten the muscles in the back of your thighs (hamstring) to "dig" as hard as you can without increasing any pain. Hold this position for __________ seconds. Release the tension gradually and allow your muscles to relax completely for __________ seconds after each repetition. Repeat __________ times. Complete this exercise __________ times a day. Hamstring curls If told by your health care provider, do this exercise while wearing ankle weights. Begin with __________ lb weights. Then increase the weight by 1 lb (0.5 kg) increments. Do not wear ankle weights that are more than __________ lb. Lie on your abdomen with your legs straight. Bend your left / right knee as far as you can without feeling pain. Keep your hips flat against the floor. Hold this position for __________ seconds. Slowly lower your leg to the starting position. Repeat __________ times. Complete this exercise __________ times a day. Squats This exercise strengthens the muscles in front of your thigh and knee (quadriceps). Stand in front of a table, with your feet and knees pointing straight ahead. You may rest your hands on the table for balance but not for support. Slowly bend your knees and lower your hips like you are going to sit  in a chair. Keep your weight over your heels, not over your toes. Keep your lower legs upright so they are parallel with the table legs. Do not let your hips go lower than your knees. Do not bend lower than told by your health care provider. If your knee pain increases, do not bend as low. Hold the squat position for __________ seconds. Slowly push with your legs to return to standing. Do not use your hands to pull yourself to standing. Repeat __________ times. Complete this exercise __________ times a day. Wall slides This exercise strengthens the muscles in front of your thigh and knee (quadriceps). Lean your back against a smooth wall or door, and walk your feet out 18-24 inches (46-61 cm) from it. Place your feet hip-width apart. Slowly slide down the wall or door until your knees bend __________ degrees. Keep your knees over your heels, not over your toes. Keep your knees in line with your hips. Hold this position for __________ seconds. Repeat __________ times. Complete this exercise __________ times a day. Straight leg raises This exercise strengthens the muscles that rotate the leg at the hip and move it away from your body (hip abductors). Lie on your side with your left / right leg in the top position. Lie so your head, shoulder, knee, and hip line up. You may bend your bottom knee to help you keep your balance. Roll your hips slightly forward so your hips are stacked directly over each other and your left / right knee is facing forward. Leading with your heel, lift your top leg 4-6 inches (10-15 cm). You should feel the muscles in your outer hip lifting. Do not let your foot drift forward. Do not let your knee roll toward the ceiling. Hold this position for __________ seconds. Slowly return your leg to the starting position. Let your muscles relax completely after each repetition. Repeat __________ times. Complete this exercise __________ times a day. Straight leg raises This  exercise stretches the muscles that move your hips away from the front of the pelvis (hip extensors). Lie on your abdomen on a  firm surface. You can put a pillow under your hips if that is more comfortable. Tense the muscles in your buttocks and lift your left / right leg about 4-6 inches (10-15 cm). Keep your knee straight as you lift your leg. Hold this position for __________ seconds. Slowly lower your leg to the starting position. Let your leg relax completely after each repetition. Repeat __________ times. Complete this exercise __________ times a day. This information is not intended to replace advice given to you by your health care provider. Make sure you discuss any questions you have with your health care provider. Document Revised: 02/04/2018 Document Reviewed: 02/04/2018 Else[employer] Patient Education  2022 [place] [facility] Ask your health care provider which exercises are safe for you. Do exercises exactly as told by your health care provider and adjust them as directed. It is normal to feel mild stretching, pulling, tightness, or discomfort as you do these exercises. Stop right away if you feel sudden pain or your pain gets significantly worse. Do not begin these exercises until told by your health care provider. Stretching and range-of-motion exercises These exercises warm up your muscles and joints and improve the movement and flexibility of your hip and pelvis. Quadriceps stretch, prone  Lie on your abdomen (prone position) on a firm surface, such as a bed or padded floor. Bend your left / right knee and reach back to hold your ankle or pant leg. If you cannot reach your ankle or pant leg, loop a belt around your foot and grab the belt instead. Gently pull your heel toward your buttocks. Your knee should not slide out to the side. You should feel a stretch in the front of your thigh and knee (quadriceps). Hold this position for __________ seconds. Repeat  __________ times. Complete this exercise __________ times a day. Iliotibial band stretch An iliotibial band is a strong band of muscle tissue that runs from the outer side of your hip to the outer side of your thigh and knee. Lie on your side with your left / right leg in the top position. Bend both of your knees and grab your left / right ankle. Stretch out your bottom arm to help you balance. Slowly bring your top knee back so your thigh goes behind your trunk. Slowly lower your top leg toward the floor until you feel a gentle stretch on the outside of your left / right hip and thigh. If you do not feel a stretch and your knee will not fall farther, place the heel of your other foot on top of your knee and pull your knee down toward the floor with your foot. Hold this position for __________ seconds. Repeat __________ times. Complete this exercise __________ times a day. Strengthening exercises These exercises build strength and endurance in your hip and pelvis. Endurance is the ability to use your muscles for a long time, even after they get tired. Straight leg raises, side-lying This exercise strengthens the muscles that rotate the leg at the hip and move it away from your body (hip abductors). Lie on your side with your left / right leg in the top position. Lie so your head, shoulder, hip, and knee line up. You may bend your bottom knee to help you balance. Roll your hips slightly forward so your hips are stacked directly over each other and your left / right knee is facing forward. Tense the muscles in your outer thigh and lift your top leg 4-6 inches (10-15 cm).  Hold this position for __________ seconds. Slowly lower your leg to return to the starting position. Let your muscles relax completely before doing another repetition. Repeat __________ times. Complete this exercise __________ times a day. Leg raises, prone This exercise strengthens the muscles that move the hips backward (hip  extensors). Lie on your abdomen (prone position) on your bed or a firm surface. You can put a pillow under your hips if that is more comfortable for your lower back. Bend your left / right knee so your foot is straight up in the air. Squeeze your buttocks muscles and lift your left / right thigh off the bed. Do not let your back arch. Tense your thigh muscle as hard as you can without increasing any knee pain. Hold this position for __________ seconds. Slowly lower your leg to return to the starting position and allow it to relax completely. Repeat __________ times. Complete this exercise __________ times a day. Hip hike Stand sideways on a bottom step. Stand on your left / right leg with your other foot unsupported next to the step. You can hold on to a railing or wall for balance if needed. Keep your knees straight and your torso square. Then lift your left / right hip up toward the ceiling. Slowly let your left / right hip lower toward the floor, past the starting position. Your foot should get closer to the floor. Do not lean or bend your knees. Repeat __________ times. Complete this exercise __________ times a day. This information is not intended to replace advice given to you by your health care provider. Make sure you discuss any questions you have with your health care provider. Document Revised: 06/24/2019 Document Reviewed: 06/24/2019 Elsevier Patient Education  [place].

## 2021-02-24 NOTE — Progress Notes (Signed)
Please send ENA, C3 and C4

## 2021-02-24 NOTE — Progress Notes (Signed)
Muscle enzyme tests which include CK and aldolase are normal TSH is normal some of the other labs are pending.

## 2021-02-27 ENCOUNTER — Encounter: Payer: Self-pay | Admitting: Rheumatology

## 2021-02-27 ENCOUNTER — Encounter: Payer: Self-pay | Admitting: Dermatology

## 2021-02-27 DIAGNOSIS — B356 Tinea cruris: Secondary | ICD-10-CM

## 2021-02-27 NOTE — Telephone Encounter (Signed)
I called patient, some labs still pending, we will call with results when they are available.

## 2021-02-28 MED ORDER — KETOCONAZOLE 2 % EX CREA: TOPICAL_CREAM | CUTANEOUS | 3 refills | Status: AC

## 2021-02-28 MED ORDER — TERBINAFINE HCL 250 MG PO TABS: 250.0000 mg | ORAL_TABLET | Freq: Every day | ORAL | 1 refills | Status: DC

## 2021-03-03 LAB — C3 AND C4
C3 Complement: 179 mg/dL (ref 82–185)
C4 Complement: 40 mg/dL (ref 15–53)

## 2021-03-03 LAB — ANTI-SCLERODERMA ANTIBODY: Scleroderma (Scl-70) (ENA) Antibody, IgG: <1 AI

## 2021-03-03 LAB — ANTI-NUCLEAR AB-TITER (ANA TITER): ANA Titer 1: 1:80 {titer} — ABNORMAL HIGH

## 2021-03-03 LAB — SJOGREN'S SYNDROME ANTIBODS(SSA + SSB)
SSA (Ro) (ENA) Antibody, IgG: <1 AI
SSB (La) (ENA) Antibody, IgG: <1 AI

## 2021-03-03 LAB — MYOSITIS ASSESSR PLUS JO-1 AUTOABS
EJ Autoabs: NOT DETECTED
Jo-1 Autoabs: <1 AI
Ku Autoabs: NOT DETECTED
Mi-2 Autoabs: NOT DETECTED
OJ Autoabs: NOT DETECTED
PL-12 Autoabs: NOT DETECTED
PL-7 Autoabs: NOT DETECTED
SRP Autoabs: NOT DETECTED

## 2021-03-03 LAB — SM AND SM/RNP ANTIBODIES
ENA SM Ab Ser-aCnc: <1 AI
SM/RNP: <1 AI

## 2021-03-03 LAB — TEST AUTHORIZATION

## 2021-03-03 LAB — TSH: TSH: 1.89 mIU/L (ref 0.40–4.50)

## 2021-03-03 LAB — CK: Total CK: 187 U/L (ref 44–196)

## 2021-03-03 LAB — ALDOLASE: Aldolase: 4.1 U/L (ref <=8.1)

## 2021-03-03 LAB — ANA: Anti Nuclear Antibody (ANA): POSITIVE — AB

## 2021-03-03 LAB — HMGCR AB (IGG): HMGCR AB (IGG): <2 CU (ref <20)

## 2021-03-03 LAB — ANTI-DNA ANTIBODY, DOUBLE-STRANDED: ds DNA Ab: <1 IU/mL

## 2021-03-07 ENCOUNTER — Encounter: Payer: No Typology Code available for payment source | Admitting: Dermatology

## 2021-03-07 NOTE — Telephone Encounter (Signed)
All the test results are within normal limits.  Repeat CK test is normal.  Myositis panel was negative.  HMG-CoA reductase antibodies are still pending.

## 2021-03-14 ENCOUNTER — Encounter: Payer: No Typology Code available for payment source | Admitting: Dermatology

## 2021-03-21 ENCOUNTER — Ambulatory Visit: Payer: No Typology Code available for payment source

## 2021-03-24 NOTE — Progress Notes (Signed)
Office Visit Note  Patient: Rick Scott             Date of Birth: 16-Aug-1980           MRN: [id number]             PCP: Jon Billings, NP Referring: Jon Billings, NP Visit Date: 04/05/2021 Occupation: @GUAROCC @  Subjective:  Left leg pain.   History of Present Illness: Rick Scott is a 40 y.o. male osteoarthritis, degenerative disc disease and scoliosis.  He states he has off-and-on discomfort in his lower back.  He also found a knot on his lower back which he is concerned that may be a lipoma.  He states that the knot has been tender off and on.  He has been doing stretching exercises with his wife who is a physical therapist.  He has not noticed any improvement with the exercises.  He states the pain is more on the anterior aspect of his hip then over the trochanteric bursa.  He experiences the pain more when he is putting on his socks.  He also has severe intermittent pain when he is walking.  He denies any numbness in that area.  The IT band stretches have not been helpful.  He continues to have some discomfort in his left knee joint.  He denies any joint swelling.  He also has muscle cramps at nighttime for which she takes tizanidine.  He states that he does not have any active rash currently.  Activities of Daily Living:  Patient reports morning stiffness for all day. Patient Reports nocturnal pain.  Difficulty dressing/grooming: Reports Difficulty climbing stairs: Denies Difficulty getting out of chair: Denies Difficulty using hands for taps, buttons, cutlery, and/or writing: Denies  Review of Systems  Constitutional:  Negative for fatigue and night sweats.  HENT:  Positive for mouth dryness and nose dryness. Negative for mouth sores.   Eyes:  Positive for dryness. Negative for pain, redness and itching.  Respiratory:  Negative for shortness of breath and difficulty breathing.   Cardiovascular:  Negative for chest pain, palpitations, hypertension,  irregular heartbeat and swelling in legs/feet.  Gastrointestinal:  Positive for constipation and diarrhea. Negative for blood in stool.  Endocrine: Negative for increased urination.  Genitourinary:  Negative for difficulty urinating.  Musculoskeletal:  Positive for joint pain, joint pain, myalgias, morning stiffness, muscle tenderness and myalgias. Negative for joint swelling and muscle weakness.  Skin:  Positive for rash. Negative for color change, hair loss, nodules/bumps, skin tightness, ulcers and sensitivity to sunlight.  Allergic/Immunologic: Positive for susceptible to infections.  Neurological:  Positive for memory loss. Negative for dizziness, fainting, numbness, headaches, night sweats and weakness.  Hematological:  Negative for bruising/bleeding tendency and swollen glands.  Psychiatric/Behavioral:  Negative for depressed mood, confusion and sleep disturbance. The patient is not nervous/anxious.    PMFS History:  Patient Active Problem List   Diagnosis Date Noted   OSA (obstructive sleep apnea) 07/01/2019   Asthma, moderate persistent 04/28/2019   BMI 34.0-34.9,adult 04/28/2019   Abdominal pain with radiation to back 11/12/2017   Testicular pain, right 11/11/2017   Family history of colonic polyps 06/17/2015   Chronic diarrhea 05/18/2015   Low back pain, unspecified 05/18/2015   Joint pain 04/30/2004   Asymptomatic varicose veins of unspecified lower extremity 04/30/1998   Irritable bowel syndrome with constipation 08/29/1994   Gastric reflux 04/30/1994   Asthma due to environmental allergies 04/30/1990   Allergies May 14, 1980    Past Medical History:  Diagnosis Date   Allergy bananas, oranges, melons   Asthma 1996   Well controlled   Chronic diarrhea    has improved recently as diet has improved   Colon polyps    GERD (gastroesophageal reflux disease)     Family History  Problem Relation Age of Onset   COPD Mother    Diabetes Mother    Cancer Mother    Heart  disease Mother    Obesity Mother    Healthy Father    Varicose Veins Father    Cancer Maternal Grandmother    Healthy Daughter    ADD / ADHD Daughter    Past Surgical History:  Procedure Laterality Date   FINGER SURGERY Right    5th digit   GANGLION CYST EXCISION     Social History   Social History Narrative   Not on file   Immunization History  Administered Date(s) Administered   Hepatitis A, Adult 06/20/2018, 12/23/2018   MMR 01/10/2018   Moderna SARS-COV2 Booster Vaccination 01/05/2021   PFIZER(Purple Top)SARS-COV-2 Vaccination 07/05/2019, 07/29/2019   Tdap 06/28/2015     Objective: Vital Signs: BP 122/86 (BP Location: Left Arm, Patient Position: Sitting, Cuff Size: Large)   Pulse 92   Ht 6' (1.829 m)   Wt 267 lb 3.2 oz (121.2 kg)   BMI 36.24 kg/m    Physical Exam Vitals and nursing note reviewed.  Constitutional:      Appearance: He is well-developed.  HENT:     Head: Normocephalic and atraumatic.  Eyes:     Conjunctiva/sclera: Conjunctivae normal.     Pupils: Pupils are equal, round, and reactive to light.  Cardiovascular:     Rate and Rhythm: Normal rate and regular rhythm.     Heart sounds: Normal heart sounds.  Pulmonary:     Effort: Pulmonary effort is normal.     Breath sounds: Normal breath sounds.  Abdominal:     General: Bowel sounds are normal.     Palpations: Abdomen is soft.  Musculoskeletal:     Cervical back: Normal range of motion and neck supple.  Skin:    General: Skin is warm and dry.     Capillary Refill: Capillary refill takes less than 2 seconds.     Comments: A soft rubbery movable subcutaneous mass was palpable in the left lower lumbar region.  Neurological:     Mental Status: He is alert and oriented to person, place, and time.  Psychiatric:        Behavior: Behavior normal.     Musculoskeletal Exam: C-spine was in good range of motion.  He has lumbar scoliosis.  There was no point tenderness over the lumbar region.  He had  tenderness over the palpable mass most likely lipoma.  Shoulder joints, elbow joints, wrist joints, MCPs PIPs and DIPs with good range of motion.  Hip joints with good range of motion.  He had mild tenderness over left trochanteric bursa.  He describes discomfort mostly over the anterior aspect of his left thigh.  I could not reproduce the pain with range of motion.  Knee joints in good range of motion without any warmth swelling or effusion.  There was no tenderness over ankles or MTPs.  CDAI Exam: CDAI Score: -- Patient Global: --; Provider Global: -- Swollen: --; Tender: -- Joint Exam 04/05/2021   No joint exam has been documented for this visit   There is currently no information documented on the homunculus. Go to the Rheumatology activity and complete  the homunculus joint exam.  Investigation: No additional findings.  Imaging: No results found.  Recent Labs: Lab Results  Component Value Date   WBC 6.7 01/26/2021   HGB 15.9 01/26/2021   PLT 224 01/26/2021   NA 142 01/26/2021   K 4.2 01/26/2021   CL 105 01/26/2021   CO2 27 01/26/2021   GLUCOSE 98 01/26/2021   BUN 19 01/26/2021   CREATININE 1.01 01/26/2021   BILITOT 0.4 01/26/2021   AST 32 01/26/2021   ALT 32 01/26/2021   PROT 7.7 01/26/2021   CALCIUM 9.8 01/26/2021    Speciality Comments: No specialty comments available.  Procedures:  No procedures performed Allergies: Avocado, Banana, Corylus, Orange oil, Doxycycline, Codeine, Hazelnut (filbert) allergy skin test, Other, and Bupropion   Assessment / Plan:     Visit Diagnoses: Left thigh pain -he complains of pain and discomfort all the anterior part of his thigh.  He had some tenderness on palpation.  He states the pain is severe when he flexes his hip joint and puts on his socks.  Plan: AMB referral to orthopedics  Trochanteric bursitis, left hip-he had mild trochanteric bursitis.  He has been doing IT band stretches and has not noticed any improvement with the  exercises.  His wife is a physical therapist.  Primary osteoarthritis of left knee - Mild osteoarthritis and chondromalacia patella was noted.  He continues to have some left knee joint discomfort.  No warmth swelling or effusion was noted.  Primary osteoarthritis of both feet-he has off-and-on discomfort in his feet.  He is doing well currently.  Pes cavus-use of arch support was discussed.  Neck pain - X-rays unremarkable.  Neck pain started in 20s.   Other idiopathic scoliosis, lumbar region-he had severe scoliosis in the lumbar spine.  DDD (degenerative disc disease), lumbar - S-shaped scoliosis, multilevel spondylosis and facet joint arthropathy.  Patient gives history of ganglion cyst excision from his lumbar spine in the past.  Plan: AMB referral to orthopedics  Chronic left SI joint pain - X-rays unremarkable -he has intermittent discomfort in the left SI joint.  Plan: AMB referral to orthopedics  Muscle cramps-he gives history of muscle cramps.  He has been taking tizanidine 4 mg at bedtime.  He states that he ran out of tizanidine.  Advised him to try magnesium malate to 50 mg at bedtime.  Side effects of magnesium malate were discussed.  He had mildly elevated CK in the past.  Repeat CK was normal.  Aldolase was normal.  Myositis panel was negative.  Hidradenitis suppurativa-resolved for patient.  Tinea cruris-he states the rash resolved after the topical therapy.  Irritable bowel syndrome with constipation-he has history of IBS and he has constipation for which she takes Linzess.  Other medical problems are listed as follows:  Moderate persistent asthma without complication  Gastric reflux  Asymptomatic varicose veins of left lower extremity  OSA (obstructive sleep apnea)  Family history of colonic polyps  Family history of psoriasis in mother  Family history of lupus erythematosus  Family history of rheumatoid arthritis  Vitamin D deficiency  Orders: Orders  Placed This Encounter  Procedures   AMB referral to orthopedics    No orders of the defined types were placed in this encounter.    Follow-Up Instructions: Return in about 6 months (around 10/04/2021) for Osteoarthritis.   Pollyann Savoy, MD  Note - This record has been created using Animal nutritionist.  Chart creation errors have been sought, but may not always  have been located. Such creation errors do not reflect on  the standard of medical care.

## 2021-04-05 ENCOUNTER — Other Ambulatory Visit: Payer: Self-pay

## 2021-04-05 ENCOUNTER — Ambulatory Visit: Payer: 59 | Admitting: Rheumatology

## 2021-04-05 ENCOUNTER — Encounter: Payer: Self-pay | Admitting: Rheumatology

## 2021-04-05 VITALS — BP 122/86 | HR 92 | Ht 72.0 in | Wt 267.2 lb

## 2021-04-05 DIAGNOSIS — M79652 Pain in left thigh: Secondary | ICD-10-CM

## 2021-04-05 DIAGNOSIS — R748 Abnormal levels of other serum enzymes: Secondary | ICD-10-CM

## 2021-04-05 DIAGNOSIS — M1712 Unilateral primary osteoarthritis, left knee: Secondary | ICD-10-CM | POA: Diagnosis not present

## 2021-04-05 DIAGNOSIS — Q667 Congenital pes cavus, unspecified foot: Secondary | ICD-10-CM

## 2021-04-05 DIAGNOSIS — M19071 Primary osteoarthritis, right ankle and foot: Secondary | ICD-10-CM | POA: Diagnosis not present

## 2021-04-05 DIAGNOSIS — B356 Tinea cruris: Secondary | ICD-10-CM

## 2021-04-05 DIAGNOSIS — K219 Gastro-esophageal reflux disease without esophagitis: Secondary | ICD-10-CM

## 2021-04-05 DIAGNOSIS — Z840 Family history of diseases of the skin and subcutaneous tissue: Secondary | ICD-10-CM

## 2021-04-05 DIAGNOSIS — Z8261 Family history of arthritis: Secondary | ICD-10-CM

## 2021-04-05 DIAGNOSIS — M533 Sacrococcygeal disorders, not elsewhere classified: Secondary | ICD-10-CM

## 2021-04-05 DIAGNOSIS — G8929 Other chronic pain: Secondary | ICD-10-CM

## 2021-04-05 DIAGNOSIS — M542 Cervicalgia: Secondary | ICD-10-CM

## 2021-04-05 DIAGNOSIS — G4733 Obstructive sleep apnea (adult) (pediatric): Secondary | ICD-10-CM

## 2021-04-05 DIAGNOSIS — E559 Vitamin D deficiency, unspecified: Secondary | ICD-10-CM

## 2021-04-05 DIAGNOSIS — M7062 Trochanteric bursitis, left hip: Secondary | ICD-10-CM

## 2021-04-05 DIAGNOSIS — I8392 Asymptomatic varicose veins of left lower extremity: Secondary | ICD-10-CM

## 2021-04-05 DIAGNOSIS — K581 Irritable bowel syndrome with constipation: Secondary | ICD-10-CM

## 2021-04-05 DIAGNOSIS — J4540 Moderate persistent asthma, uncomplicated: Secondary | ICD-10-CM

## 2021-04-05 DIAGNOSIS — L732 Hidradenitis suppurativa: Secondary | ICD-10-CM

## 2021-04-05 DIAGNOSIS — M19072 Primary osteoarthritis, left ankle and foot: Secondary | ICD-10-CM

## 2021-04-05 DIAGNOSIS — M5136 Other intervertebral disc degeneration, lumbar region: Secondary | ICD-10-CM

## 2021-04-05 DIAGNOSIS — R252 Cramp and spasm: Secondary | ICD-10-CM

## 2021-04-05 DIAGNOSIS — M4126 Other idiopathic scoliosis, lumbar region: Secondary | ICD-10-CM

## 2021-04-05 DIAGNOSIS — Z8371 Family history of colonic polyps: Secondary | ICD-10-CM

## 2021-04-20 ENCOUNTER — Ambulatory Visit: Payer: 59 | Admitting: Specialist

## 2021-04-20 ENCOUNTER — Other Ambulatory Visit: Payer: Self-pay

## 2021-04-20 ENCOUNTER — Encounter: Payer: Self-pay | Admitting: Specialist

## 2021-04-20 ENCOUNTER — Ambulatory Visit: Payer: Self-pay

## 2021-04-20 VITALS — BP 127/86 | HR 71 | Ht 72.0 in | Wt 267.0 lb

## 2021-04-20 DIAGNOSIS — M5136 Other intervertebral disc degeneration, lumbar region: Secondary | ICD-10-CM

## 2021-04-20 DIAGNOSIS — M47816 Spondylosis without myelopathy or radiculopathy, lumbar region: Secondary | ICD-10-CM | POA: Diagnosis not present

## 2021-04-20 DIAGNOSIS — M545 Low back pain, unspecified: Secondary | ICD-10-CM | POA: Diagnosis not present

## 2021-04-20 DIAGNOSIS — D171 Benign lipomatous neoplasm of skin and subcutaneous tissue of trunk: Secondary | ICD-10-CM | POA: Diagnosis not present

## 2021-04-20 MED ORDER — DICLOFENAC-MISOPROSTOL 50-0.2 MG PO TBEC: 1.0000 | DELAYED_RELEASE_TABLET | Freq: Two times a day (BID) | ORAL | 2 refills | Status: DC

## 2021-04-20 NOTE — Patient Instructions (Signed)
Avoid frequent bending and stooping  No lifting greater than 10 lbs. May use ice or moist heat for pain. Weight loss is of benefit. Best medication for lumbar disc disease is arthritis medications like motrin, celebrex and naprosyn. Exercise is important to improve your indurance and does allow people to function better inspite of back pain.   

## 2021-04-20 NOTE — Progress Notes (Addendum)
Office Visit Note   Patient: Rick Scott           Date of Birth: 06-29-1980           MRN: [id number] Visit Date: 04/20/2021              Requested by: Bo Merino, MD [address],  [place] [address] PCP: Jon Billings, NP   Assessment & Plan: Visit Diagnoses:  1. Low back pain, unspecified back pain laterality, unspecified chronicity, unspecified whether sciatica present   2. Lipoma of lumbosacral region     Plan: Avoid frequent bending and stooping  No lifting greater than 10 lbs. May use ice or moist heat for pain. Weight loss is of benefit. Best medication for lumbar disc disease is arthritis medications like motrin, celebrex and naprosyn. Exercise is important to improve your indurance and does allow people to function better inspite of back pain. He was given a trigger injection at the point of most severe pain, left PSIS  lipoma that was reproducibly tender Will see how he does with injection, if no improvement then MRI will be done to assess for a cause for radicular pain left L5 Distribution.  Follow-Up Instructions: Return in about 3 weeks (around 05/11/2021).   Orders:  Orders Placed This Encounter  Procedures   Trigger Point Inj   XR Lumb Spine Flex&Ext Only   No orders of the defined types were placed in this encounter.     Procedures: Trigger Point Inj  Date/Time: 04/20/2021 4:55 PM Performed by: Jessy Oto, MD Authorized by: Jessy Oto, MD   Site marked: the procedure site was marked   Indications:  Pain Total # of Trigger Points:  1 Location: back   Needle Size:  22 G Approach:  Dorsal Comments: Bandaid applied   Clinical Data: No additional findings.   Subjective: Chief Complaint  Patient presents with   Lower Back - Pain   Left Hip - Pain    40 year old male with 3 month history of left upper buttock pain and a lipoma in this area that is painful. History of right lumbar disc  herniation, L5-S1 this responded to 4 th ESI. He has had some amount of pain intermittantly over years (since graduate school) almost 20 years. Repetitive motion, walking affects the back. Pain with prolong sitting and riding in a car. Can't sit for more than 2 hours at time.   Review of Systems  HENT:  Positive for congestion and postnasal drip. Negative for sinus pressure.   Eyes:  Positive for itching and visual disturbance (dryness, had lasik surgery).  Respiratory:  Positive for apnea.   Cardiovascular: Negative.   Gastrointestinal:  Positive for constipation and diarrhea. Negative for abdominal distention, abdominal pain, anal bleeding, blood in stool, nausea, rectal pain and vomiting.  Endocrine: Negative for cold intolerance, heat intolerance, polydipsia, polyphagia and polyuria.  Genitourinary: Negative.  Negative for decreased urine volume, difficulty urinating, dysuria, enuresis, flank pain, frequency, genital sores, hematuria, penile discharge, penile pain, penile swelling, scrotal swelling, testicular pain and urgency.  Musculoskeletal:  Positive for back pain, gait problem, neck pain and neck stiffness. Negative for arthralgias.  Skin: Negative.  Negative for color change, pallor, rash and wound.  Allergic/Immunologic: Negative.  Negative for environmental allergies, food allergies and immunocompromised state.  Neurological:  Positive for weakness and numbness.  Hematological: Negative.  Negative for adenopathy. Does not bruise/bleed easily.  Psychiatric/Behavioral:  Positive for decreased concentration. Negative  for agitation, behavioral problems, confusion, dysphoric mood, hallucinations, self-injury, sleep disturbance and suicidal ideas. The patient is not nervous/anxious and is not hyperactive.     Objective: Vital Signs: BP 127/86 (BP Location: Left Arm, Patient Position: Sitting)    Pulse 71    Ht 6' (1.829 m)    Wt 267 lb (121.1 kg)    BMI 36.21 kg/m   Physical  Exam Constitutional:      Appearance: He is well-developed.  HENT:     Head: Normocephalic and atraumatic.  Eyes:     Pupils: Pupils are equal, round, and reactive to light.  Pulmonary:     Effort: Pulmonary effort is normal.     Breath sounds: Normal breath sounds.  Abdominal:     General: Bowel sounds are normal.     Palpations: Abdomen is soft.  Musculoskeletal:        General: Normal range of motion.     Cervical back: Normal range of motion and neck supple.  Skin:    General: Skin is warm and dry.  Neurological:     Mental Status: He is alert and oriented to person, place, and time.  Psychiatric:        Behavior: Behavior normal.        Thought Content: Thought content normal.        Judgment: Judgment normal.    Back Exam   Tenderness  The patient is experiencing tenderness in the lumbar.  Range of Motion  Flexion:  normal  Lateral bend right:  normal  Lateral bend left:  normal  Rotation right:  normal  Rotation left:  normal   Muscle Strength  Right Quadriceps:  5/5  Left Quadriceps:  5/5  Right Hamstrings:  5/5  Left Hamstrings:  5/5   Reflexes  Patellar:  2/4 Achilles:  0/4  Comments:  Right ankle reflex  is absent left knee and ankle reflex are normal.  Pain left lateral mid thigh with internal and external rotation of the left hip.     Specialty Comments:  No specialty comments available.  Imaging: No results found.   PMFS History: Patient Active Problem List   Diagnosis Date Noted   OSA (obstructive sleep apnea) 07/01/2019   Asthma, moderate persistent 04/28/2019   BMI 34.0-34.9,adult 04/28/2019   Abdominal pain with radiation to back 11/12/2017   Testicular pain, right 11/11/2017   Family history of colonic polyps 06/17/2015   Chronic diarrhea 05/18/2015   Low back pain, unspecified 05/18/2015   Joint pain 04/30/2004   Asymptomatic varicose veins of unspecified lower extremity 04/30/1998   Irritable bowel syndrome with  constipation 08/29/1994   Gastric reflux 04/30/1994   Asthma due to environmental allergies 04/30/1990   Allergies 1979-02-09   Past Medical History:  Diagnosis Date   Allergy bananas, oranges, melons   Asthma 1996   Well controlled   Chronic diarrhea    has improved recently as diet has improved   Colon polyps    GERD (gastroesophageal reflux disease)     Family History  Problem Relation Age of Onset   COPD Mother    Diabetes Mother    Cancer Mother    Heart disease Mother    Obesity Mother    Healthy Father    Varicose Veins Father    Cancer Maternal Grandmother    Healthy Daughter    ADD / ADHD Daughter     Past Surgical History:  Procedure Laterality Date   FINGER SURGERY Right  5th digit   GANGLION CYST EXCISION     Social History   Occupational History   Not on file  Tobacco Use   Smoking status: Never   Smokeless tobacco: Never  Vaping Use   Vaping Use: Never used  Substance and Sexual Activity   Alcohol use: Not Currently   Drug use: Never   Sexual activity: Yes    Birth control/protection: I.U.D.

## 2021-04-26 ENCOUNTER — Encounter: Payer: Self-pay | Admitting: Specialist

## 2021-05-03 NOTE — Addendum Note (Signed)
Addended by: Basil Dess on: 05/03/2021 11:27 AM   Modules accepted: Orders

## 2021-05-11 ENCOUNTER — Other Ambulatory Visit: Payer: Self-pay | Admitting: Family Medicine

## 2021-05-17 ENCOUNTER — Ambulatory Visit
Admission: RE | Admit: 2021-05-17 | Discharge: 2021-05-17 | Disposition: A | Discharge status: Home / Self Care | Payer: 59 | Source: Ambulatory Visit | Attending: Specialist | Admitting: Specialist

## 2021-05-17 DIAGNOSIS — M47816 Spondylosis without myelopathy or radiculopathy, lumbar region: Secondary | ICD-10-CM

## 2021-05-17 DIAGNOSIS — M5136 Other intervertebral disc degeneration, lumbar region: Secondary | ICD-10-CM

## 2021-05-17 DIAGNOSIS — M545 Low back pain, unspecified: Secondary | ICD-10-CM

## 2021-05-17 DIAGNOSIS — D171 Benign lipomatous neoplasm of skin and subcutaneous tissue of trunk: Secondary | ICD-10-CM

## 2021-05-25 ENCOUNTER — Encounter: Payer: Self-pay | Admitting: Specialist

## 2021-05-25 ENCOUNTER — Other Ambulatory Visit: Payer: Self-pay

## 2021-05-25 ENCOUNTER — Ambulatory Visit: Payer: 59 | Admitting: Specialist

## 2021-05-25 VITALS — BP 110/79 | HR 94 | Ht 72.0 in | Wt 267.0 lb

## 2021-05-25 DIAGNOSIS — M5136 Other intervertebral disc degeneration, lumbar region: Secondary | ICD-10-CM

## 2021-05-25 DIAGNOSIS — D171 Benign lipomatous neoplasm of skin and subcutaneous tissue of trunk: Secondary | ICD-10-CM

## 2021-05-25 DIAGNOSIS — M5116 Intervertebral disc disorders with radiculopathy, lumbar region: Secondary | ICD-10-CM

## 2021-05-25 DIAGNOSIS — M47816 Spondylosis without myelopathy or radiculopathy, lumbar region: Secondary | ICD-10-CM

## 2021-05-25 NOTE — Patient Instructions (Signed)
Avoid bending, stooping and avoid lifting weights greater than 10 lbs. Avoid prolong standing and walking. Avoid frequent bending and stooping  No lifting greater than 10 lbs. May use ice or moist heat for pain. Weight loss is of benefit. Handicap license is approved. Dr. Newton's secretary/Assistant will call to arrange for epidural steroid injection  

## 2021-05-25 NOTE — Progress Notes (Signed)
Office Visit Note   Patient: Rick Scott           Date of Birth: 25-Apr-1981           MRN: [id number] Visit Date: 05/25/2021              Requested by: Jon Billings, NP [address] [place],  [place] [address] PCP: Jon Billings, NP   Assessment & Plan: Visit Diagnoses:  1. Degenerative disc disease, lumbar   2. Spondylosis without myelopathy or radiculopathy, lumbar region   3. Lipoma of lumbosacral region     Plan: Avoid frequent bending and stooping  No lifting greater than 10 lbs. May use ice or moist heat for pain. Weight loss is of benefit. Best medication for lumbar disc disease is arthritis medications like motrin, celebrex and naprosyn. Exercise is important to improve your indurance and does allow people to function better inspite of back pain.  Follow-Up Instructions: No follow-ups on file.   Orders:  No orders of the defined types were placed in this encounter.  No orders of the defined types were placed in this encounter.     Procedures: No procedures performed   Clinical Data: No additional findings.   Subjective: Chief Complaint  Patient presents with   Lower Back - Follow-up    MRI Review    41 year old male with history of right sided ESI in the past for disc protrusion. He had 4 ESIs that seemed to treat the right side. Had recent MRI. Pain with prolong standing and bending and stooping. Not lifting and does not do a lot of twisting.   Review of Systems  Constitutional: Negative.   HENT: Negative.    Eyes: Negative.   Respiratory: Negative.    Cardiovascular: Negative.   Gastrointestinal: Negative.   Endocrine: Negative.   Genitourinary: Negative.   Musculoskeletal: Negative.   Skin: Negative.   Allergic/Immunologic: Negative.   Neurological: Negative.   Hematological: Negative.   Psychiatric/Behavioral: Negative.      Objective: Vital Signs: BP 110/79 (BP Location: Left Arm, Patient Position: Sitting)    Pulse 94   Ht 6' (1.829 m)   Wt 267 lb (121.1 kg)   BMI 36.21 kg/m   Physical Exam Constitutional:      Appearance: He is well-developed.  HENT:     Head: Normocephalic and atraumatic.  Eyes:     Pupils: Pupils are equal, round, and reactive to light.  Pulmonary:     Effort: Pulmonary effort is normal.     Breath sounds: Normal breath sounds.  Abdominal:     General: Bowel sounds are normal.     Palpations: Abdomen is soft.  Musculoskeletal:     Cervical back: Normal range of motion and neck supple.  Skin:    General: Skin is warm and dry.  Neurological:     Mental Status: He is alert and oriented to person, place, and time.  Psychiatric:        Behavior: Behavior normal.        Thought Content: Thought content normal.        Judgment: Judgment normal.   Back Exam   Tenderness  The patient is experiencing tenderness in the lumbar.  Range of Motion  Extension:  abnormal  Flexion:  abnormal  Lateral bend right:  abnormal  Lateral bend left:  abnormal  Rotation right:  abnormal   Muscle Strength  Right Quadriceps:  5/5  Left Quadriceps:  5/5  Right Hamstrings:  5/5  Left Hamstrings:  5/5   Reflexes  Patellar:  2/4 Achilles:  2/4  Other  Toe walk: normal Heel walk: normal    Specialty Comments:  No specialty comments available.  Imaging: No results found.   PMFS History: Patient Active Problem List   Diagnosis Date Noted   OSA (obstructive sleep apnea) 07/01/2019   Asthma, moderate persistent 04/28/2019   BMI 34.0-34.9,adult 04/28/2019   Abdominal pain with radiation to back 11/12/2017   Testicular pain, right 11/11/2017   Family history of colonic polyps 06/17/2015   Chronic diarrhea 05/18/2015   Low back pain, unspecified 05/18/2015   Joint pain 04/30/2004   Asymptomatic varicose veins of unspecified lower extremity 04/30/1998   Irritable bowel syndrome with constipation 08/29/1994   Gastric reflux 04/30/1994   Asthma due to environmental  allergies 04/30/1990   Allergies 1978-11-02   Past Medical History:  Diagnosis Date   Allergy bananas, oranges, melons   Asthma 1996   Well controlled   Chronic diarrhea    has improved recently as diet has improved   Colon polyps    GERD (gastroesophageal reflux disease)     Family History  Problem Relation Age of Onset   COPD Mother    Diabetes Mother    Cancer Mother    Heart disease Mother    Obesity Mother    Healthy Father    Varicose Veins Father    Cancer Maternal Grandmother    Healthy Daughter    ADD / ADHD Daughter     Past Surgical History:  Procedure Laterality Date   FINGER SURGERY Right    5th digit   GANGLION CYST EXCISION     Social History   Occupational History   Not on file  Tobacco Use   Smoking status: Never   Smokeless tobacco: Never  Vaping Use   Vaping Use: Never used  Substance and Sexual Activity   Alcohol use: Not Currently   Drug use: Never   Sexual activity: Yes    Birth control/protection: I.U.D.

## 2021-05-26 ENCOUNTER — Telehealth: Payer: Self-pay | Admitting: Physical Medicine and Rehabilitation

## 2021-05-26 NOTE — Telephone Encounter (Signed)
Pt called for referral sent. Please call pt at [phone].

## 2021-05-29 ENCOUNTER — Ambulatory Visit: Payer: No Typology Code available for payment source | Admitting: Nurse Practitioner

## 2021-06-15 ENCOUNTER — Ambulatory Visit (INDEPENDENT_AMBULATORY_CARE_PROVIDER_SITE_OTHER): Payer: 59 | Admitting: Physical Medicine and Rehabilitation

## 2021-06-15 ENCOUNTER — Other Ambulatory Visit: Payer: Self-pay

## 2021-06-15 ENCOUNTER — Encounter: Payer: Self-pay | Admitting: Physical Medicine and Rehabilitation

## 2021-06-15 ENCOUNTER — Ambulatory Visit: Payer: Self-pay

## 2021-06-15 VITALS — BP 114/82 | HR 96

## 2021-06-15 DIAGNOSIS — M5416 Radiculopathy, lumbar region: Secondary | ICD-10-CM

## 2021-06-15 MED ORDER — METHYLPREDNISOLONE ACETATE 80 MG/ML IJ SUSP: 80.0000 mg | Freq: Once | INTRAMUSCULAR | Status: DC

## 2021-06-15 NOTE — Procedures (Signed)
Lumbosacral Transforaminal Epidural Steroid Injection - Sub-Pedicular Approach with Fluoroscopic Guidance  Patient: Rick Scott      Date of Birth: Mar 24, 1981 MRN: [id number] PCP: Jon Billings, NP      Visit Date: 06/15/2021   Universal Protocol:    Date/Time: 06/15/2021  Consent Given By: the patient  Position: PRONE  Additional Comments: Vital signs were monitored before and after the procedure. Patient was prepped and draped in the usual sterile fashion. The correct patient, procedure, and site was verified.   Injection Procedure Details:   Procedure diagnoses: Lumbar radiculopathy [M54.16]    Meds Administered:  Meds ordered this encounter  Medications   methylPREDNISolone acetate (DEPO-MEDROL) injection 80 mg    Laterality: Left  Location/Site: L4  Needle:5.0 in., 22 ga.  Short bevel or Quincke spinal needle  Needle Placement: Transforaminal  Findings:    -Comments: Excellent flow of contrast along the nerve, nerve root and into the epidural space.  Procedure Details: After squaring off the end-plates to get a true AP view, the C-arm was positioned so that an oblique view of the foramen as noted above was visualized. The target area is just inferior to the "nose of the scotty dog" or sub pedicular. The soft tissues overlying this structure were infiltrated with 2-3 ml. of 1% Lidocaine without Epinephrine.  The spinal needle was inserted toward the target using a "trajectory" view along the fluoroscope beam.  Under AP and lateral visualization, the needle was advanced so it did not puncture dura and was located close the 6 O'Clock position of the pedical in AP tracterory. Biplanar projections were used to confirm position. Aspiration was confirmed to be negative for CSF and/or blood. A 1-2 ml. volume of Isovue-250 was injected and flow of contrast was noted at each level. Radiographs were obtained for documentation purposes.   After attaining the desired  flow of contrast documented above, a 0.5 to 1.0 ml test dose of 0.25% Marcaine was injected into each respective transforaminal space.  The patient was observed for 90 seconds post injection.  After no sensory deficits were reported, and normal lower extremity motor function was noted,   the above injectate was administered so that equal amounts of the injectate were placed at each foramen (level) into the transforaminal epidural space.   Additional Comments:  The patient tolerated the procedure well Dressing: 2 x 2 sterile gauze and Band-Aid    Post-procedure details: Patient was observed during the procedure. Post-procedure instructions were reviewed.  Patient left the clinic in stable condition.

## 2021-06-15 NOTE — Progress Notes (Signed)
Rick Scott - 41 y.o. male MRN [id number]  Date of birth: 12/11/1980  Office Visit Note: Visit Date: 06/15/2021 PCP: Jon Billings, NP Referred by: Jon Billings, NP  Subjective: Chief Complaint  Patient presents with   Lower Back - Pain   Left Knee - Pain   HPI:  Rick Scott is a 41 y.o. male who comes in today at the request of Dr. Basil Dess for planned Left L4-5 Lumbar Transforaminal epidural steroid injection with fluoroscopic guidance.  The patient has failed conservative care including home exercise, medications, time and activity modification.  This injection will be diagnostic and hopefully therapeutic.  Please see requesting physician notes for further details and justification.   ROS Otherwise per HPI.  Assessment & Plan: Visit Diagnoses:    ICD-10-CM   1. Lumbar radiculopathy  M54.16 XR C-ARM NO REPORT    Epidural Steroid injection    DISCONTINUED: methylPREDNISolone acetate (DEPO-MEDROL) injection 80 mg      Plan: No additional findings.   Meds & Orders:  Meds ordered this encounter  Medications   DISCONTD: methylPREDNISolone acetate (DEPO-MEDROL) injection 80 mg    Orders Placed This Encounter  Procedures   XR C-ARM NO REPORT   Epidural Steroid injection    Follow-up: Return for visit to requesting provider as needed.   Procedures: No procedures performed  Lumbosacral Transforaminal Epidural Steroid Injection - Sub-Pedicular Approach with Fluoroscopic Guidance  Patient: Rick Scott      Date of Birth: 11/14/1980 MRN: [id number] PCP: Jon Billings, NP      Visit Date: 06/15/2021   Universal Protocol:    Date/Time: 06/15/2021  Consent Given By: the patient  Position: PRONE  Additional Comments: Vital signs were monitored before and after the procedure. Patient was prepped and draped in the usual sterile fashion. The correct patient, procedure, and site was verified.   Injection Procedure Details:    Procedure diagnoses: Lumbar radiculopathy [M54.16]    Meds Administered:  Meds ordered this encounter  Medications   methylPREDNISolone acetate (DEPO-MEDROL) injection 80 mg    Laterality: Left  Location/Site: L4  Needle:5.0 in., 22 ga.  Short bevel or Quincke spinal needle  Needle Placement: Transforaminal  Findings:    -Comments: Excellent flow of contrast along the nerve, nerve root and into the epidural space.  Procedure Details: After squaring off the end-plates to get a true AP view, the C-arm was positioned so that an oblique view of the foramen as noted above was visualized. The target area is just inferior to the "nose of the scotty dog" or sub pedicular. The soft tissues overlying this structure were infiltrated with 2-3 ml. of 1% Lidocaine without Epinephrine.  The spinal needle was inserted toward the target using a "trajectory" view along the fluoroscope beam.  Under AP and lateral visualization, the needle was advanced so it did not puncture dura and was located close the 6 O'Clock position of the pedical in AP tracterory. Biplanar projections were used to confirm position. Aspiration was confirmed to be negative for CSF and/or blood. A 1-2 ml. volume of Isovue-250 was injected and flow of contrast was noted at each level. Radiographs were obtained for documentation purposes.   After attaining the desired flow of contrast documented above, a 0.5 to 1.0 ml test dose of 0.25% Marcaine was injected into each respective transforaminal space.  The patient was observed for 90 seconds post injection.  After no sensory deficits were reported, and normal lower extremity motor function was noted,  the above injectate was administered so that equal amounts of the injectate were placed at each foramen (level) into the transforaminal epidural space.   Additional Comments:  The patient tolerated the procedure well Dressing: 2 x 2 sterile gauze and Band-Aid    Post-procedure  details: Patient was observed during the procedure. Post-procedure instructions were reviewed.  Patient left the clinic in stable condition.     Clinical History: MRI LUMBAR SPINE WITHOUT CONTRAST   TECHNIQUE: Multiplanar, multisequence MR imaging of the lumbar spine was performed. No intravenous contrast was administered.   COMPARISON:  Prior radiograph from 04/20/2021.   FINDINGS: Segmentation: Standard. Lowest well-formed disc space labeled the L5-S1 level.   Alignment: Sigmoid scoliotic curvature of the lumbar spine. 2-3 mm anterolisthesis of L4 on L5, with trace 2 mm anterolisthesis of L5 on S1. Findings chronic and facet mediated.   Vertebrae: Vertebral body height maintained without acute or chronic fracture. Bone marrow signal intensity within normal limits. Subcentimeter benign hemangioma noted within the L3 vertebral body. No other discrete or worrisome osseous lesions. No abnormal marrow edema.   Conus medullaris and cauda equina: Conus extends to the T12-L1 level. Conus and cauda equina appear normal.   Paraspinal and other soft tissues: Paraspinous soft tissues within normal limits. Subcentimeter simple cyst noted within the interpolar right kidney. Visualized visceral structures otherwise unremarkable.   Disc levels:   L1-2: Disc desiccation with mild disc bulge. Mild facet hypertrophy. No spinal stenosis. Foramina remain patent.   L2-3: Negative interspace. Mild bilateral facet hypertrophy. No spinal stenosis. Foramina remain patent.   L3-4: Degenerative intervertebral disc space narrowing with diffuse disc bulge and disc desiccation. Superimposed left extraforaminal disc protrusion with annular fissure closely approximates the exiting left L3 nerve root (series 5, image 24). Moderate bilateral facet hypertrophy. No significant spinal stenosis. Foramina remain patent.   L4-5: Trace anterolisthesis with mild intervertebral disc space narrowing. Disc  desiccation with mild disc bulge. Superimposed left extraforaminal disc protrusion contacts the exiting left L4 nerve root (series 5, image 31). Moderate bilateral facet hypertrophy. Resultant mild spinal stenosis with mild bilateral L4 foraminal narrowing.   L5-S1: Degenerative intervertebral disc space narrowing with disc desiccation and diffuse disc bulge. Superimposed lobulated central to left foraminal disc protrusion (series 5, image 37). Protruding disc closely approximates the descending left S1 nerve root without frank impingement. Moderate right worse than left facet arthrosis. Resultant mild left greater than right lateral recess stenosis. Central canal remains patent. No significant foraminal encroachment.   IMPRESSION: 1. Left extraforaminal disc protrusion at L3-4, closely approximating and potentially irritating the exiting left L3 nerve root. 2. Left extraforaminal disc protrusion at L4-5, contacting the exiting left L4 nerve root. 3. Central to left foraminal disc protrusion at L5-S1, closely approximating and potentially irritating the descending left S1 nerve root. 4. Scoliosis with moderate bilateral facet hypertrophy at L3-4 through L5-S1. Finding could contribute to lower back pain.     Electronically Signed   By: Jeannine Boga M.D.   On: 05/18/2021 04:50     Objective:  VS:  HT:     WT:    BMI:      BP:114/82   HR:96bpm   TEMP: ( )   RESP:  Physical Exam Vitals and nursing note reviewed.  Constitutional:      General: He is not in acute distress.    Appearance: Normal appearance. He is not ill-appearing.  HENT:     Head: Normocephalic and atraumatic.     Right  Ear: External ear normal.     Left Ear: External ear normal.     Nose: No congestion.  Eyes:     Extraocular Movements: Extraocular movements intact.  Cardiovascular:     Rate and Rhythm: Normal rate.     Pulses: Normal pulses.  Pulmonary:     Effort: Pulmonary effort is normal. No  respiratory distress.  Abdominal:     General: There is no distension.     Palpations: Abdomen is soft.  Musculoskeletal:        General: No tenderness or signs of injury.     Cervical back: Neck supple.     Right lower leg: No edema.     Left lower leg: No edema.     Comments: Patient has good distal strength without clonus.  Skin:    Findings: No erythema or rash.  Neurological:     General: No focal deficit present.     Mental Status: He is alert and oriented to person, place, and time.     Sensory: No sensory deficit.     Motor: No weakness or abnormal muscle tone.     Coordination: Coordination normal.  Psychiatric:        Mood and Affect: Mood normal.        Behavior: Behavior normal.     Imaging: No results found.

## 2021-06-15 NOTE — Patient Instructions (Signed)

## 2021-06-15 NOTE — Progress Notes (Signed)
Pt state lower back pain that travels to his left knee. Pt state laying down makes the pain worse. Pt state he takes pain meds to help ease his pain. Pt state he fell on Friday and may have broke his foot.  Numeric Pain Rating Scale and Functional Assessment Average Pain 4   In the last MONTH (on 0-10 scale) has pain interfered with the following?  1. General activity like being  able to carry out your everyday physical activities such as walking, climbing stairs, carrying groceries, or moving a chair?  Rating(8)   +Driver, -BT, -Dye Allergies.

## 2021-06-27 ENCOUNTER — Other Ambulatory Visit: Payer: Self-pay | Admitting: Specialist

## 2021-07-21 ENCOUNTER — Ambulatory Visit: Payer: 59 | Admitting: Specialist

## 2021-07-21 ENCOUNTER — Encounter: Payer: Self-pay | Admitting: Specialist

## 2021-07-21 ENCOUNTER — Other Ambulatory Visit: Payer: Self-pay

## 2021-07-21 VITALS — BP 128/81 | HR 99 | Temp 98.4°F | Ht 72.0 in | Wt 267.0 lb

## 2021-07-21 DIAGNOSIS — M5416 Radiculopathy, lumbar region: Secondary | ICD-10-CM | POA: Diagnosis not present

## 2021-07-21 DIAGNOSIS — M5116 Intervertebral disc disorders with radiculopathy, lumbar region: Secondary | ICD-10-CM | POA: Diagnosis not present

## 2021-07-21 DIAGNOSIS — M4156 Other secondary scoliosis, lumbar region: Secondary | ICD-10-CM

## 2021-07-21 DIAGNOSIS — M5136 Other intervertebral disc degeneration, lumbar region: Secondary | ICD-10-CM | POA: Diagnosis not present

## 2021-07-21 DIAGNOSIS — M47816 Spondylosis without myelopathy or radiculopathy, lumbar region: Secondary | ICD-10-CM | POA: Diagnosis not present

## 2021-07-21 NOTE — Patient Instructions (Signed)
?  Plan: Avoid frequent bending and stooping  ?No lifting greater than 10 lbs. ?May use ice or moist heat for pain. ?Weight loss is of benefit. ?Best medication for lumbar disc disease is arthritis medications like motrin, celebrex and naprosyn. ?Exercise is important to improve your indurance and does allow people to function better inspite of back pain. 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  ?Follow-Up Instructions: Return in about 3 weeks (around 05/11/2021).  ?  ?

## 2021-07-21 NOTE — Progress Notes (Signed)
? ?Office Visit Note ?  ?Patient: Rick Scott           ?Date of Birth: [birth year]           ?MRN: [id number] ?Visit Date: 07/21/2021 ?             ?Requested by: Jon Billings, NP ?[address] ?[place],  [place] [address] ?PCP: Jon Billings, NP ? ? ?Assessment & Plan: ?Visit Diagnoses:  ?1. Lumbar radiculopathy   ?2. Lumbar disc herniation with radiculopathy   ?3. Degenerative disc disease, lumbar   ?4. Spondylosis without myelopathy or radiculopathy, lumbar region   ?5. Other secondary scoliosis, lumbar region   ? ? ?Plan: Avoid frequent bending and stooping  ?No lifting greater than 10 lbs. ?May use ice or moist heat for pain. ?Weight loss is of benefit. ?Best medication for lumbar disc disease is arthritis medications like motrin, celebrex and naprosyn. ?Exercise is important to improve your indurance and does allow people to function better inspite of back pain. 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  ?Follow-Up Instructions: Return in about 3 weeks (around 05/11/2021).  ?  ? ?Follow-Up Instructions: No follow-ups on file.  ? ?Orders:  ?No orders of the defined types were placed in this encounter. ? ?No orders of the defined types were placed in this encounter. ? ? ? ? Procedures: ?No procedures performed ? ? ?Clinical Data: ?No additional findings. ? ? ?Subjective: ?Chief Complaint  ?Patient presents with  ? Lower Back - Follow-up  ? ? ?HPI ? ?Review of Systems  ?Constitutional: Negative.   ?HENT: Negative.    ?Eyes: Negative.   ?Respiratory: Negative.    ?Cardiovascular: Negative.   ?Gastrointestinal: Negative.   ?Endocrine: Negative.   ?Genitourinary: Negative.   ?Musculoskeletal: Negative.   ?Skin: Negative.   ?Allergic/Immunologic: Negative.   ?Neurological: Negative.    ?Hematological: Negative.   ?Psychiatric/Behavioral: Negative.    ? ? ?Objective: ?Vital Signs: BP 128/81 (BP Location: Left Arm, Patient Position: Sitting, Cuff Size: Large)   Pulse 99   Temp 98.4 ?F (36.9 ?C) (Oral)   Ht 6' (1.829 m)   Wt 267 lb (121.1 kg)   BMI 36.21 kg/m?  ? ?Physical Exam ?Constitutional:   ?   Appearance: He is well-developed.  ?HENT:  ?   Head: Normocephalic and atraumatic.  ?Eyes:  ?   Pupils: Pupils are equal, round, and reactive to light.  ?Pulmonary:  ?   Effort: Pulmonary effort is normal.  ?   Breath sounds: Normal breath sounds.  ?Abdominal:  ?   General: Bowel sounds are normal.  ?   Palpations: Abdomen is soft.  ?Musculoskeletal:  ?   Cervical back: Normal range of motion and neck supple.  ?   Lumbar back: Negative right straight leg raise test and negative left straight leg raise test.  ?Skin: ?   General: Skin is warm and dry.  ?Neurological:  ?   Mental Status: He is alert and oriented to person, place, and time.  ?Psychiatric:     ?   Behavior: Behavior normal.     ?   Thought Content: Thought content normal.     ?   Judgment: Judgment normal.  ? ? ?Back Exam  ? ?Tenderness  ?The  patient is experiencing tenderness in the lumbar. ? ?Range of Motion  ?Rotation right:  abnormal  ?Rotation left:  abnormal  ? ?Muscle Strength  ?Right Quadriceps:  5/5  ?Left Quadriceps:  5/5  ?Right Hamstrings:  5/5  ?Left Hamstrings:  5/5  ? ?Tests  ?Straight leg raise right: negative ?Straight leg raise left: negative ? ?Comments:  Motor left leg is intact. ? ? ? ? ?Specialty Comments:  ?No specialty comments available. ? ?Imaging: ?No results found. ? ? ?PMFS History: ?Patient Active Problem List  ? Diagnosis Date Noted  ? OSA (obstructive sleep apnea) 07/01/2019  ? Asthma, moderate persistent 04/28/2019  ? BMI 34.0-34.9,adult 04/28/2019  ? Abdominal pain with radiation to back 11/12/2017  ? Testicular pain, right 11/11/2017  ? Family history of colonic polyps 06/17/2015  ? Chronic diarrhea  05/18/2015  ? Low back pain, unspecified 05/18/2015  ? Joint pain 04/30/2004  ? Asymptomatic varicose veins of unspecified lower extremity 04/30/1998  ? Irritable bowel syndrome with constipation 08/29/1994  ? Gastric reflux 04/30/1994  ? Asthma due to environmental allergies 04/30/1990  ? Allergies 12/16/1978  ? ?Past Medical History:  ?Diagnosis Date  ? Allergy bananas, oranges, melons  ? Asthma 1996  ? Well controlled  ? Chronic diarrhea   ? has improved recently as diet has improved  ? Colon polyps   ? GERD (gastroesophageal reflux disease)   ?  ?Family History  ?Problem Relation Age of Onset  ? COPD Mother   ? Diabetes Mother   ? Cancer Mother   ? Heart disease Mother   ? Obesity Mother   ? Healthy Father   ? Varicose Veins Father   ? Cancer Maternal Grandmother   ? Healthy Daughter   ? ADD / ADHD Daughter   ?  ?Past Surgical History:  ?Procedure Laterality Date  ? FINGER SURGERY Right   ? 5th digit  ? GANGLION CYST EXCISION    ? ?Social History  ? ?Occupational History  ? Not on file  ?Tobacco Use  ? Smoking status: Never  ? Smokeless tobacco: Never  ?Vaping Use  ? Vaping Use: Never used  ?Substance and Sexual Activity  ? Alcohol use: Not Currently  ? Drug use: Never  ? Sexual activity: Yes  ?  Birth control/protection: I.U.D.  ? ? ? ? ? ? ?

## 2021-07-24 ENCOUNTER — Other Ambulatory Visit: Payer: Self-pay

## 2021-07-24 ENCOUNTER — Ambulatory Visit: Payer: 59 | Admitting: Dermatology

## 2021-07-24 DIAGNOSIS — D225 Melanocytic nevi of trunk: Secondary | ICD-10-CM

## 2021-07-24 DIAGNOSIS — D229 Melanocytic nevi, unspecified: Secondary | ICD-10-CM | POA: Diagnosis not present

## 2021-07-24 DIAGNOSIS — R21 Rash and other nonspecific skin eruption: Secondary | ICD-10-CM

## 2021-07-24 DIAGNOSIS — L578 Other skin changes due to chronic exposure to nonionizing radiation: Secondary | ICD-10-CM

## 2021-07-24 DIAGNOSIS — Z1283 Encounter for screening for malignant neoplasm of skin: Secondary | ICD-10-CM | POA: Diagnosis not present

## 2021-07-24 DIAGNOSIS — D1800 Hemangioma unspecified site: Secondary | ICD-10-CM

## 2021-07-24 DIAGNOSIS — L821 Other seborrheic keratosis: Secondary | ICD-10-CM

## 2021-07-24 DIAGNOSIS — L814 Other melanin hyperpigmentation: Secondary | ICD-10-CM

## 2021-07-24 MED ORDER — PIMECROLIMUS 1 % EX CREA: TOPICAL_CREAM | CUTANEOUS | 1 refills | Status: DC

## 2021-07-24 MED ORDER — CLOBETASOL PROPIONATE 0.05 % EX CREA: TOPICAL_CREAM | CUTANEOUS | 0 refills | Status: DC

## 2021-07-24 NOTE — Progress Notes (Signed)
? ?Follow-Up Visit ?  ?Subjective  ?Rick Scott is a 41 y.o. male who presents for the following: Annual Exam. ? ?The patient presents for Upper-Body Skin Exam (UBSE) for skin cancer screening and mole check.  The patient has spots, moles and lesions to be evaluated, some may be new or changing. He also has a, itchy rash of the left inguinal crease x 2 mos, not improving with ketoconazole 2% cream. He also has a itchy rash on the right arm and lower abdomen that started 1 month ago in antecubital area after giving blood, not flaky. He has used TMC 0.1% Cream without much improvement. He has a history of asthma and allergies as well as other rashes, and has had allergy testing in the past with multiple allergies noted. He has a couple of growths on his face and scalp he would like checked.  ?  ?The following portions of the chart were reviewed this encounter and updated as appropriate:  ?  ?  ? ?Review of Systems:  No other skin or systemic complaints except as noted in HPI or Assessment and Plan. ? ?Objective  ?Well appearing patient in no apparent distress; mood and affect are within normal limits. ? ?A focused examination was performed including all skin waist up, inguinal crease. Relevant physical exam findings are noted in the Assessment and Plan. ? ?right antecubitum, lower central abdomen, left inguinal ?Pink edematous plaque of the right antecubitum, lower central abdomen; mild erythema left inguinal crease ? ?Mid Back ?Small med to dark brown macules of the arms, mid and upper back. ? ? ? ?Assessment & Plan  ?Skin cancer screening performed today. ? ?Actinic Damage ?- chronic, secondary to cumulative UV radiation exposure/sun exposure over time ?- diffuse scaly erythematous macules with underlying dyspigmentation ?- Recommend daily broad spectrum sunscreen SPF 30+ to sun-exposed areas, reapply every 2 hours as needed.  ?- Recommend staying in the shade or wearing long sleeves, sun glasses (UVA+UVB  protection) and wide brim hats (4-inch brim around the entire circumference of the hat). ?- Call for new or changing lesions. ? ?Melanocytic Nevi ?- Tan-brown and/or pink-flesh-colored symmetric macules and papules, including flesh papule of the right temporal scalp ?- Benign appearing on exam today ?- Observation ?- Call clinic for new or changing moles ?- Recommend daily use of broad spectrum spf 30+ sunscreen to sun-exposed areas.  ? ?Seborrheic Keratoses ?- Stuck-on, waxy, tan-brown papules and/or plaques, including right cheek/temple ?- Benign-appearing ?- Discussed benign etiology and prognosis. ?- Observe ?- Call for any changes ? ?Lentigines ?- Scattered tan macules ?- Due to sun exposure ?- Benign-appering, observe ?- Recommend daily broad spectrum sunscreen SPF 30+ to sun-exposed areas, reapply every 2 hours as needed. ?- Call for any changes ? ?Hemangiomas ?- Red papules ?- Discussed benign nature ?- Observe ?- Call for any changes ? ?Rash ?right antecubitum, lower central abdomen, left inguinal ? ?Contact Dermatitis vs Contact Urticaria vrs other dermatitis, currently flared. ? ?Discussed TRUE Test Patch Testing.  ? ?Patch testing was performed on back using standard technique. True test x 36 applied to back at visit today. Pt advised to keep panels dry until removal in 2 days for first read.   ? ?Once Patch Testing is complete, increase Zyrtec to po bid OR add Xyzal once daily in addition to Zyrtec once daily. ? ?Start clobetasol cream Apply to affected area rash on arm twice daily no longer than 2 weeks until improved dsp 30g 0Rf ? ?Start Elidel Cream  Apply to AA rash groin and body qd/bid prn dsp 60g 1Rf. ? ?If not improving with topicals, discussed biopsy. ? ?Topical steroids (such as triamcinolone, fluocinolone, fluocinonide, mometasone, clobetasol, halobetasol, betamethasone, hydrocortisone) can cause thinning and lightening of the skin if they are used for too long in the same area. Your physician  has selected the right strength medicine for your problem and area affected on the body. Please use your medication only as directed by your physician to prevent side effects.  ? ? ? ? ?clobetasol cream (TEMOVATE) 0.05 % - right antecubitum, lower central abdomen, left inguinal ?Apply to affected area rash on arm no longer than 2 weeks. ? ?pimecrolimus (ELIDEL) 1 % cream - right antecubitum, lower central abdomen, left inguinal ?Apply to affected areas groin and body twice daily until improved. ? ?Patch Test - right antecubitum, lower central abdomen, left inguinal ? ?Nevus ?Mid Back ? ?Benign-appearing.  Observation.  Call clinic for new or changing moles.  Recommend daily use of broad spectrum spf 30+ sunscreen to sun-exposed areas.  ? ? ?Return in about 2 days (around 07/26/2021) for patch test, 7 day with Dr Chauncey Cruel. ? ?I, Jamesetta Orleans, CMA, am acting as scribe for Brendolyn Patty, MD . ?Documentation: I have reviewed the above documentation for accuracy and completeness, and I agree with the above. ? ?Brendolyn Patty MD  ? ?

## 2021-07-24 NOTE — Patient Instructions (Addendum)
Once Patch Testing is complete, increase Zyrtec to twice daily OR add Xyzal one daily. Avoid taking antihistamines until Patch Testing complete.  ?Pt advised to keep panels dry until removal in 2 days for first read.   ? ?Seborrheic Keratosis ? ?What causes seborrheic keratoses? ?Seborrheic keratoses are harmless, common skin growths that first appear during adult life.  As time goes by, more growths appear.  Some people may develop a large number of them.  Seborrheic keratoses appear on both covered and uncovered body parts.  They are not caused by sunlight.  The tendency to develop seborrheic keratoses can be inherited.  They vary in color from skin-colored to gray, brown, or even black.  They can be either smooth or have a rough, warty surface.   ?Seborrheic keratoses are superficial and look as if they were stuck on the skin.  Under the microscope this type of keratosis looks like layers upon layers of skin.  That is why at times the top layer may seem to fall off, but the rest of the growth remains and re-grows.   ? ?Treatment ?Seborrheic keratoses do not need to be treated, but can easily be removed in the office.  Seborrheic keratoses often cause symptoms when they rub on clothing or jewelry.  Lesions can be in the way of shaving.  If they become inflamed, they can cause itching, soreness, or burning.  Removal of a seborrheic keratosis can be accomplished by freezing, burning, or surgery. ?If any spot bleeds, scabs, or grows rapidly, please return to have it checked, as these can be an indication of a skin cancer. ? ?If You Need Anything After Your Visit ? ?If you have any questions or concerns for your doctor, please call our main line at [phone] and press option 4 to reach your doctor's medical assistant. If no one answers, please leave a voicemail as directed and we will return your call as soon as possible. Messages left after 4 pm will be answered the following business day.  ? ?You may also send [place]  a message via MyChart. We typically respond to MyChart messages within 1-2 business days. ? ?For prescription refills, please ask your pharmacy to contact our office. Our fax number is [phone]. ? ?If you have an urgent issue when the clinic is closed that cannot wait until the next business day, you can page your doctor at the number below.   ? ?Please note that while we do our best to be available for urgent issues outside of office hours, we are not available 24/7.  ? ?If you have an urgent issue and are unable to reach [place], you may choose to seek medical care at your doctor's office, retail clinic, urgent care center, or emergency room. ? ?If you have a medical emergency, please immediately call 911 or go to the emergency department. ? ?Pager Numbers ? ?- Dr. Nehemiah Massed: [phone] ? ?- Dr. Laurence Ferrari: [phone] ? ?- Dr. Nicole Kindred: [phone] ? ?In the event of inclement weather, please call our main line at [phone] for an update on the status of any delays or closures. ? ?Dermatology Medication Tips: ?Please keep the boxes that topical medications come in in order to help keep track of the instructions about where and how to use these. Pharmacies typically print the medication instructions only on the boxes and not directly on the medication tubes.  ? ?If your medication is too expensive, please contact our office at [phone] option 4 or send [place] a message through  MyChart.  ? ?We are unable to tell what your co-pay for medications will be in advance as this is different depending on your insurance coverage. However, we may be able to find a substitute medication at lower cost or fill out paperwork to get insurance to cover a needed medication.  ? ?If a prior authorization is required to get your medication covered by your insurance company, please allow [place] 1-2 business days to complete this process. ? ?Drug prices often vary depending on where the prescription is filled and some pharmacies may offer  cheaper prices. ? ?The website www.goodrx.com contains coupons for medications through different pharmacies. The prices here do not account for what the cost may be with help from insurance (it may be cheaper with your insurance), but the website can give you the price if you did not use any insurance.  ?- You can print the associated coupon and take it with your prescription to the pharmacy.  ?- You may also stop by our office during regular business hours and pick up a GoodRx coupon card.  ?- If you need your prescription sent electronically to a different pharmacy, notify our office through [facility] or by phone at [phone] option 4. ? ? ? ? ?Si Usted Necesita Algo Despu?s de Su Visita ? ?Tambi?n puede enviarnos un mensaje a trav?s de MyChart. Por lo general respondemos a los mensajes de MyChart en el transcurso de 1 a 2 d?as h?biles. ? ?Para renovar recetas, por favor pida a su farmacia que se ponga en contacto con nuestra oficina. Nuestro n?mero de fax es el [phone]. ? ?Si tiene un asunto urgente cuando la cl?nica est? cerrada y que no puede esperar hasta el siguiente d?a h?bil, puede llamar/localizar a su doctor(a) al n?mero que aparece a continuaci?n.  ? ?Por favor, tenga en cuenta que aunque hacemos todo lo posible para estar disponibles para asuntos urgentes fuera del horario de oficina, no estamos disponibles las 24 horas del d?a, los 7 d?as de la semana.  ? ?Si tiene un problema urgente y no puede comunicarse con nosotros, puede optar por buscar atenci?n m?dica  en el consultorio de su doctor(a), en una cl?nica privada, en un centro de atenci?n urgente o en una sala de emergencias. ? ?Si tiene Engineer, maintenance (IT) m?dica, por favor llame inmediatamente al 911 o vaya a la sala de emergencias. ? ?N?meros de b?per ? ?- Dr. Nehemiah Massed: [phone] ? ?- Dra. Moye: [phone] ? ?- Dra. Nicole Kindred: [phone] ? ?En caso de inclemencias del tiempo, por favor llame a nuestra l?nea principal al  [phone] para una actualizaci?n sobre el estado de cualquier retraso o cierre. ? ?Consejos para la medicaci?n en dermatolog?a: ?Por favor, guarde las cajas en las que vienen los medicamentos de uso t?pico para ayudarle a seguir las instrucciones sobre d?nde y c?mo usarlos. Las farmacias generalmente imprimen las instrucciones del medicamento s?lo en las cajas y no directamente en los tubos del [place].  ? ?Si su medicamento es muy caro, por favor, p?ngase en contacto con Zigmund Daniel llamando al [phone] y presione la opci?n 4 o env?enos un mensaje a trav?s de MyChart.  ? ?No podemos decirle cu?l ser? su copago por los medicamentos por adelantado ya que esto es diferente dependiendo de la cobertura de su seguro. Sin embargo, es posible que podamos encontrar un medicamento sustituto a Electrical engineer un formulario para que el seguro cubra el medicamento que se considera necesario.  ? ?Si se requiere Ardelia Mems autorizaci?n previa  para que su compa??a de seguros [place] su medicamento, por favor perm?tanos de 1 a 2 d?as h?biles para completar este proceso. ? ?Los precios de los medicamentos var?an con frecuencia dependiendo del Environmental consultant de d?nde se surte la receta y alguna farmacias pueden ofrecer precios m?s baratos. ? ?El sitio web www.goodrx.com tiene cupones para medicamentos de Airline pilot. Los precios aqu? no tienen en cuenta lo que podr?a costar con la ayuda del seguro (puede ser m?s barato con su seguro), pero el sitio web puede darle el precio si no utiliz? ning?n seguro.  ?- Puede imprimir el cup?n correspondiente y llevarlo con su receta a la farmacia.  ?- Tambi?n puede pasar por nuestra oficina durante el horario de atenci?n regular y recoger una tarjeta de cupones de GoodRx.  ?- Si necesita que su receta se env?e electr?nicamente a Chiropodist, informe a nuestra oficina a trav?s de MyChart de [place] o por tel?fono llamando al [phone] y presione la opci?n 4. ? ?

## 2021-07-26 ENCOUNTER — Ambulatory Visit (INDEPENDENT_AMBULATORY_CARE_PROVIDER_SITE_OTHER): Payer: 59 | Admitting: Dermatology

## 2021-07-26 DIAGNOSIS — L239 Allergic contact dermatitis, unspecified cause: Secondary | ICD-10-CM

## 2021-07-26 NOTE — Progress Notes (Signed)
Patient here today for two day patch test. Patient has an active rash on the right antecubital, lower central abdomen and left inguinal fold.  ?Patient has weak reaction to site #28 for Gold.  ? ?Patient states the Clobetasol made the rashes burn and have become more inflamed since Monday's visit. Advised OK to use the Triamcinolone cream on the right antecubital and lower abdomen until follow up to at least help with itch.  ? ?Photos taken and placed in media today.  ? ?Johnsie Kindred, RMA ? ?Documentation: I have reviewed the above documentation for accuracy and completeness, and I agree with the above. ? ?Brendolyn Patty MD  ?

## 2021-07-28 ENCOUNTER — Ambulatory Visit
Admission: RE | Admit: 2021-07-28 | Discharge: 2021-07-28 | Disposition: A | Discharge status: Home / Self Care | Payer: 59 | Source: Ambulatory Visit | Attending: Family Medicine | Admitting: Family Medicine

## 2021-07-28 ENCOUNTER — Other Ambulatory Visit: Payer: Self-pay | Admitting: Family Medicine

## 2021-07-28 DIAGNOSIS — N390 Urinary tract infection, site not specified: Secondary | ICD-10-CM

## 2021-07-28 DIAGNOSIS — R109 Unspecified abdominal pain: Secondary | ICD-10-CM

## 2021-07-31 ENCOUNTER — Encounter: Payer: Self-pay | Admitting: Dermatology

## 2021-07-31 ENCOUNTER — Ambulatory Visit: Payer: 59 | Admitting: Dermatology

## 2021-07-31 DIAGNOSIS — R21 Rash and other nonspecific skin eruption: Secondary | ICD-10-CM | POA: Diagnosis not present

## 2021-07-31 NOTE — Patient Instructions (Addendum)

## 2021-07-31 NOTE — Progress Notes (Signed)
? ?Follow-Up Visit ?  ?Subjective  ?Rick Scott is a 41 y.o. male who presents for the following: Follow-up. ? ?Patient here for 1 week follow-up TRUE Test Patch Test. He has had a mild reaction to Gold. Rash is currently flared on his arms and abdomen, and his eyes are red and itchy. He tried clobetasol cream and Elidel cream, but made rash worse. He is currently using Calamine lotion, which helps itch temporarily. Patient has allergies and mild asthma. He saw an allergist years ago and was on allergy shots which were helpful.  ? ?The following portions of the chart were reviewed this encounter and updated as appropriate:  ?  ?  ? ?Review of Systems:  No other skin or systemic complaints except as noted in HPI or Assessment and Plan. ? ?Objective  ?Well appearing patient in no apparent distress; mood and affect are within normal limits. ? ?A focused examination was performed including face, trunk, arms. Relevant physical exam findings are noted in the Assessment and Plan. ? ?Right Antecubital Fossa, lower abdomen ?Pink edematous plaque of the right antecubitum, lower central abdomen, with mild scale today ? ?True test negative today.  Nickel was negative ? ? ? ?Assessment & Plan  ?Rash ?Right Antecubital Fossa, lower abdomen ? ?No improvement. Dermatitis (atopic vrs contact) vrs urticarial hypersensitivity rxn vrs other, punch biopsy today ? ?Restart antihistamines (Zyrtec) and increase to twice daily.  ?Start Opzelura Cream Apply to AA rash bid. Sample given.  ? ?TRUE Test Patch test revealed questionable reaction to Gold at 48 hours. Clear today.  No other positives noted.  Gold reaction probably not relevant since pt doesn't wear gold. ? ?Pending biopsy results, will refer patient to allergist. Patient prefers [facility] and will send a message in MyChart with preferred location.   ? ? T.R.U.E. Test   ? ? [place] Name 07/31/21 0800  ?  ?  ?  ?  ? Manufacturer Other      ? Location Back      ? Number of Test  36      ? Reading Interval Day 8      ? Panel Panel 1;Panel 2;Panel 3      ? 28. Gold Sodium Thiosulfate 1      ? ?  ?  ? ?  ? ? ? ?Skin / nail biopsy - Right Antecubital Fossa, lower abdomen ?Type of biopsy: punch   ?Informed consent: discussed and consent obtained   ?Patient was prepped and draped in usual sterile fashion: Area prepped with alcohol. ?Anesthesia: the lesion was anesthetized in a standard fashion   ?Anesthetic:  1% lidocaine w/ epinephrine 1-100,000 buffered w/ 8.4% NaHCO3 ?Punch size:  3.5 mm ?Suture size:  4-0 ?Suture type: nylon   ?Suture removal (days):  7 ?Hemostasis achieved with: suture and pressure   ?Outcome: patient tolerated procedure well   ?Post-procedure details: wound care instructions given   ?Post-procedure details comment:  Ointment and pressure dressing applied ? ?Specimen 1 - Surgical pathology ?Differential Diagnosis: Dermatitis, Contact vs Atopic vs Urticarial Hypersensitivity Reaction vs other ?3-4 wk hx of persist itchy rash; Pink edematous plaque of the right antecubitum, lower central abdomen ?Check Margins: No ?3.38m punch ? ?Related Medications ?clobetasol cream (TEMOVATE) 0.05 % ?Apply to affected area rash on arm no longer than 2 weeks. ? ?pimecrolimus (ELIDEL) 1 % cream ?Apply to affected areas groin and body twice daily until improved. ? ? ?Return in about 1 week (around 08/07/2021) for with nurse  for suture removal. ? ?I, Jamesetta Orleans, CMA, am acting as scribe for Brendolyn Patty, MD . ? ?Documentation: I have reviewed the above documentation for accuracy and completeness, and I agree with the above. ? ?Brendolyn Patty MD  ? ?

## 2021-08-02 ENCOUNTER — Telehealth: Payer: Self-pay

## 2021-08-02 ENCOUNTER — Other Ambulatory Visit: Payer: Self-pay

## 2021-08-02 DIAGNOSIS — L309 Dermatitis, unspecified: Secondary | ICD-10-CM

## 2021-08-02 DIAGNOSIS — L2089 Other atopic dermatitis: Secondary | ICD-10-CM

## 2021-08-02 MED ORDER — OPZELURA 1.5 % EX CREA: 1.0000 "application " | TOPICAL_CREAM | Freq: Two times a day (BID) | CUTANEOUS | 3 refills | Status: DC

## 2021-08-02 NOTE — Telephone Encounter (Signed)
Left pt message to call for bx results.  Opzelura bid aa sent to [facility]./sh ?

## 2021-08-02 NOTE — Telephone Encounter (Signed)
Patient advised of information per Dr. Nicole Kindred and Davy Pique.  ?

## 2021-08-02 NOTE — Telephone Encounter (Signed)
-----   Message from Brendolyn Patty, MD sent at 08/01/2021  5:24 PM EDT ----- ?Skin , lower abdomen ?PSORIASIFORM AND SPONGIOTIC DERMATITIS ? ?This is c/w atopic dermatitis.  Have him continue the Opzelura cream twice daily and send Rx to [facility], pt has tried and failed  clobetasol and elidel.  Also send referral to allergist. ? ?- please call patient ?

## 2021-08-03 ENCOUNTER — Other Ambulatory Visit: Payer: Self-pay | Admitting: Family Medicine

## 2021-08-03 DIAGNOSIS — K769 Liver disease, unspecified: Secondary | ICD-10-CM

## 2021-08-07 ENCOUNTER — Ambulatory Visit (INDEPENDENT_AMBULATORY_CARE_PROVIDER_SITE_OTHER): Payer: 59 | Admitting: Dermatology

## 2021-08-07 DIAGNOSIS — L2081 Atopic neurodermatitis: Secondary | ICD-10-CM

## 2021-08-07 DIAGNOSIS — Z4802 Encounter for removal of sutures: Secondary | ICD-10-CM

## 2021-08-07 NOTE — Progress Notes (Signed)
? ?  Follow-Up Visit ?  ?Subjective  ?Rick Scott is a 41 y.o. male who presents for the following: PSORIASIFORM AND SPONGIOTIC DERMATITIS bx proven (1 wk f/u for punch bx of the R lower abdomen). ? ?The following portions of the chart were reviewed this encounter and updated as appropriate:  ? Tobacco  Allergies  Meds  Problems  Med Hx  Surg Hx  Fam Hx   ?  ?Review of Systems:  No other skin or systemic complaints except as noted in HPI or Assessment and Plan. ? ?Objective  ?Well appearing patient in no apparent distress; mood and affect are within normal limits. ? ?A focused examination was performed including abdomen. Relevant physical exam findings are noted in the Assessment and Plan. ? ?Left Abdomen (side) - Lower ?Healing bx site ? ? 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 ?PSORIASIFORM AND SPONGIOTIC DERMATITIS bx proven ? ?Encounter for Removal of Sutures ?- Incision site at the R lower abdomen is clean, dry and intact ?- Wound cleansed, sutures removed, wound cleansed and steri strips applied.  ?- Discussed pathology results showing PSORIASIFORM AND SPONGIOTIC DERMATITIS  ?- Patient advised to keep steri-strips dry until they fall off. ?- Scars remodel for a full year. ?- Once steri-strips fall off, patient can apply over-the-counter silicone scar cream each night to help with scar remodeling if desired. ?- Patient advised to call with any concerns or if they notice any new or changing lesions.  ? ?Cont Opzelura cr bid to aa rash ? ?Return for as scheduled. ? ?I, [name], RMA, am acting as scribe for Sarina Ser, MD . ?Documentation: I have reviewed the above documentation for accuracy and completeness, and I agree with the above. ? ?Sarina Ser, MD ? ?

## 2021-08-07 NOTE — Patient Instructions (Signed)

## 2021-08-08 ENCOUNTER — Encounter: Payer: Self-pay | Admitting: Dermatology

## 2021-08-17 ENCOUNTER — Inpatient Hospital Stay: Admission: RE | Admit: 2021-08-17 | Payer: 59 | Source: Ambulatory Visit

## 2021-08-25 ENCOUNTER — Ambulatory Visit
Admission: RE | Admit: 2021-08-25 | Discharge: 2021-08-25 | Disposition: A | Discharge status: Home / Self Care | Payer: 59 | Source: Ambulatory Visit | Attending: Family Medicine | Admitting: Family Medicine

## 2021-08-25 DIAGNOSIS — K769 Liver disease, unspecified: Secondary | ICD-10-CM

## 2021-08-25 MED ORDER — GADOBENATE DIMEGLUMINE 529 MG/ML IV SOLN
20.0000 mL | Freq: Once | INTRAVENOUS | Status: AC | PRN
  Administered 2021-08-25: 20 mL via INTRAVENOUS

## 2021-08-28 ENCOUNTER — Ambulatory Visit: Payer: 59 | Admitting: Physical Medicine and Rehabilitation

## 2021-08-28 ENCOUNTER — Ambulatory Visit: Payer: Self-pay

## 2021-08-28 ENCOUNTER — Encounter: Payer: Self-pay | Admitting: Physical Medicine and Rehabilitation

## 2021-08-28 VITALS — BP 111/78 | HR 84

## 2021-08-28 DIAGNOSIS — M5416 Radiculopathy, lumbar region: Secondary | ICD-10-CM | POA: Diagnosis not present

## 2021-08-28 MED ORDER — METHYLPREDNISOLONE ACETATE 80 MG/ML IJ SUSP
80.0000 mg | Freq: Once | INTRAMUSCULAR | Status: AC
  Administered 2021-08-28: 80 mg

## 2021-08-28 NOTE — Progress Notes (Signed)
Pt state lower back pain that travels to his left leg. Pt state laying down makes the pain worse. Pt state he takes pain meds to help ease his pain. ? ?Numeric Pain Rating Scale and Functional Assessment ?Average Pain 3 ? ? ?In the last MONTH (on 0-10 scale) has pain interfered with the following? ? ?1. General activity like being  able to carry out your everyday physical activities such as walking, climbing stairs, carrying groceries, or moving a chair?  ?Rating(6) ? ? ?+Driver, -BT, -Dye Allergies. ? ?

## 2021-08-28 NOTE — Patient Instructions (Signed)

## 2021-08-29 ENCOUNTER — Other Ambulatory Visit: Payer: 59

## 2021-08-29 ENCOUNTER — Encounter: Payer: Self-pay | Admitting: Allergy and Immunology

## 2021-08-29 ENCOUNTER — Ambulatory Visit: Payer: 59 | Admitting: Allergy and Immunology

## 2021-08-29 VITALS — BP 124/82 | HR 80 | Temp 97.1°F | Resp 16 | Ht 72.0 in | Wt 265.0 lb

## 2021-08-29 DIAGNOSIS — J301 Allergic rhinitis due to pollen: Secondary | ICD-10-CM

## 2021-08-29 DIAGNOSIS — H1013 Acute atopic conjunctivitis, bilateral: Secondary | ICD-10-CM | POA: Diagnosis not present

## 2021-08-29 DIAGNOSIS — J3089 Other allergic rhinitis: Secondary | ICD-10-CM | POA: Diagnosis not present

## 2021-08-29 DIAGNOSIS — T781XXA Other adverse food reactions, not elsewhere classified, initial encounter: Secondary | ICD-10-CM | POA: Diagnosis not present

## 2021-08-29 DIAGNOSIS — T781XXD Other adverse food reactions, not elsewhere classified, subsequent encounter: Secondary | ICD-10-CM

## 2021-08-29 DIAGNOSIS — H1010 Acute atopic conjunctivitis, unspecified eye: Secondary | ICD-10-CM

## 2021-08-29 DIAGNOSIS — J4530 Mild persistent asthma, uncomplicated: Secondary | ICD-10-CM | POA: Diagnosis not present

## 2021-08-29 DIAGNOSIS — L2089 Other atopic dermatitis: Secondary | ICD-10-CM

## 2021-08-29 MED ORDER — OLOPATADINE HCL 0.2 % OP SOLN
OPHTHALMIC | 5 refills | Status: DC
Start: 2021-08-29 — End: 2022-02-05

## 2021-08-29 MED ORDER — ALBUTEROL SULFATE HFA 108 (90 BASE) MCG/ACT IN AERS: INHALATION_SPRAY | RESPIRATORY_TRACT | 1 refills | Status: AC

## 2021-08-29 MED ORDER — OPZELURA 1.5 % EX CREA: 1.0000 "application " | TOPICAL_CREAM | Freq: Two times a day (BID) | CUTANEOUS | 3 refills | Status: AC

## 2021-08-29 MED ORDER — OMEPRAZOLE 40 MG PO CPDR: 40.0000 mg | DELAYED_RELEASE_CAPSULE | Freq: Every morning | ORAL | 1 refills | Status: DC

## 2021-08-29 MED ORDER — RYALTRIS 665-25 MCG/ACT NA SUSP: 2.0000 | Freq: Two times a day (BID) | NASAL | 1 refills | Status: DC

## 2021-08-29 MED ORDER — EPINEPHRINE 0.3 MG/0.3ML IJ SOAJ: 0.3000 mg | INTRAMUSCULAR | 1 refills | Status: AC | PRN

## 2021-08-29 NOTE — Patient Instructions (Addendum)
?  1.  Allergen avoidance measures - dust mite, cat, horse, pollens, specific foods ? ?2.  Treat and prevent inflammation: ? ?A. Ryaltris - 2 sprays each nostril 2 times per day (specialty pharmacy) ?B. Opzelura - apply to dermatitis 1-7 times per week ? ?3.  Treat and prevent reflux/LPR: ? ?A. Decrease caffeine consumption ?B. Omeprazole 40 mg - 1 tablet in AM ? ?4.  If needed: ? ?A. Nasal saline ?B. OTC antihistamine - cetirizine / loratadine ?C. Pataday - 1 drop each eye 1 time per day ?D. Albuterol HFA - 2 inhalations every 4-6 hours ?E. Epi-Pen, benadryl, MD/ER evaluation for allergic reaction ? ?5.  Consider starting a course of immunotherapy ? ?6.  Return to clinic in 4 weeks or earlier if problem ?

## 2021-08-29 NOTE — Progress Notes (Signed)
?[place] ? ? ?Dear Dr. Nicole Kindred, ? ?Thank you for referring Rick Scott to the [facility] on 08/29/2021.  ? ?Below is a summation of this patient's evaluation and recommendations. ? ?Thank you for your referral. I will keep you informed about this patient's response to treatment.  ? ?If you have any questions please do not hesitate to contact me.  ? ?Sincerely, ? ?Jiles Prows, MD ?Allergy / Immunology ?[facility] [place] ? ? ?______________________________________________________________________ ? ? ? ?NEW PATIENT NOTE ? ?Referring Provider: Brendolyn Patty, MD ?Primary Provider: Midge Minium, [place] ?Date of office visit: 08/29/2021 ?   ?Subjective:  ? ?Chief Complaint:  Rick Scott (DOB: 1980/10/13) is a 41 y.o. male who presents to the clinic on 08/29/2021 with a chief complaint of Allergy Testing (Environmental and Food), Eczema, and Asthma ?.    ? ?HPI: Rick Scott presents to this clinic in evaluation of allergic disease.  He has a long history of allergic disease dating back to teenage years for which he received 4 years of immunotherapy starting in 2007 with good results. ? ?Within several years of discontinuing his immunotherapy treatment he had redevelopment of his allergies involving his eyes and nose.  Since moving to [place] in 2016 he has had lots of problems with nasal congestion and sneezing and itchy watery eyes occurring on a perennial basis with spring and fall exacerbation especially following exposure to pollen.  He does not have any associated anosmia or decreased ability to taste or history of ugly nasal discharge.  Zyrtec and Flonase does appear to help this issue somewhat. ? ?As well, he has had some activity of his longstanding asthma but still not particularly very active.  His requirement for short acting bronchodilators is about once  every other week.  If he exerts himself to any extent he will sometimes get some shortness of breath.  Cold air does not precipitate any symptoms.  He does not use a controller agent for his asthma. ? ?He has had a problem with atopic dermatitis and has been under the care of a dermatologist who performed patch testing which did not identify an obvious contact irritant or allergen.  Usually his atopic dermatitis involves his arms and sometimes his groin.  This appears to flare during the spring and sometimes the summer and sometimes the winter.  He was unresponsive to multiple agents but since he started a topical Jak 2 inhibitor, Ozelura, this has worked really well and has basically eliminated his atopic dermatitis. ? ?He does have lots of throat clearing and postnasal drip and feeling that there is something running down his throat.  He does have reflux disease and uses Tums and Rolaids on a daily basis.  He drinks a coffee in the morning and a coffee in the afternoon and has a soda once a day. ? ?He does snore.  Apparently had a sleep test performed in the past which showed "mild" sleep apnea. ? ?If he eats cucumbers, melons, bananas, avocado, hazelnut, he gets intense throat itching.  Cucumbers also upsets his stomach.  Oranges makes him vomit. ? ?Past Medical History:  ?Diagnosis Date  ? Allergy bananas, oranges, melons  ? Asthma 1996  ? Well controlled  ? Chronic diarrhea   ? has improved recently as diet has improved  ? Colon polyps   ? Eczema   ? GERD (  gastroesophageal reflux disease)   ? ? ?Past Surgical History:  ?Procedure Laterality Date  ? FINGER SURGERY Right   ? 5th digit  ? GANGLION CYST EXCISION    ? ? ?Allergies as of 08/29/2021   ? ?   Reactions  ? Avocado Swelling  ? Burning in throat  ? Banana Swelling  ? Corylus Swelling  ? Orange Oil Nausea And Vomiting  ? Oranges   ? Doxycycline Rash  ? Codeine Nausea And Vomiting  ? Flu like symptoms  ? Hazelnut (filbert) Allergy Skin Test   ? Other Other  (See Comments)  ? Burning in throat ?Burning in throat  ? Bupropion Other (See Comments)  ? Dissociative symptoms  ? ?  ? ?  ?Medication List  ? ? ?albuterol 108 (90 Base) MCG/ACT inhaler ?Commonly known as: VENTOLIN HFA ?Inhale into the lungs as needed. ?  ?calcium carbonate 500 MG chewable tablet ?Commonly known as: TUMS - dosed in mg elemental calcium ?Chew 1 tablet by mouth as needed for indigestion or heartburn. ?  ?Cetirizine HCl 10 MG Caps ?  ?clindamycin 1 % lotion ?Commonly known as: Cleocin-T ?Apply topically as directed. Qd to bid aa cyst R axilla and R groin ?  ?Diclofenac-miSOPROStol 50-0.2 MG Tbec ?TAKE 1 TABLET BY MOUTH TWICE  DAILY AT 12 NOON AND 4 PM ?  ?fluticasone 50 MCG/ACT nasal spray ?Commonly known as: FLONASE ?Place into both nostrils daily. ?  ?Ibuprofen 200 MG Caps ?Take by mouth as needed. ?  ?ketoconazole 2 % cream ?Commonly known as: NIZORAL ?Apply to aa's rash QHS. ?  ?Linzess 72 MCG capsule ?Generic drug: linaclotide ?Take 72 mcg by mouth every morning. ?  ?metFORMIN 500 MG 24 hr tablet ?Commonly known as: GLUCOPHAGE-XR ?Take 500 mg by mouth daily. ?  ?Opzelura 1.5 % Crea ?Generic drug: Ruxolitinib Phosphate ?Apply 1 application. topically 2 (two) times daily. Bid aa eczema until clear, then prn flares ?  ? ?Review of systems negative except as noted in HPI / PMHx or noted below: ? ?Review of Systems  ?Constitutional: Negative.   ?HENT: Negative.    ?Eyes: Negative.   ?Respiratory: Negative.    ?Cardiovascular: Negative.   ?Gastrointestinal: Negative.   ?Genitourinary: Negative.   ?Musculoskeletal: Negative.   ?Skin: Negative.   ?Neurological: Negative.   ?Endo/Heme/Allergies: Negative.   ?Psychiatric/Behavioral: Negative.    ? ?Family History  ?Problem Relation Age of Onset  ? COPD Mother   ? Diabetes Mother   ? Cancer Mother   ? Heart disease Mother   ? Obesity Mother   ? Healthy Father   ? Varicose Veins Father   ? Cancer Maternal Grandmother   ? Healthy Daughter   ? ADD / ADHD  Daughter   ? ? ?Social History  ? ?Socioeconomic History  ? Marital status: Married  ?  Spouse name: Not on file  ? Number of children: Not on file  ? Years of education: Not on file  ? Highest education level: Not on file  ?Occupational History  ? Not on file  ?Tobacco Use  ? Smoking status: Never  ?  Passive exposure: Never  ? Smokeless tobacco: Never  ?Vaping Use  ? Vaping Use: Never used  ?Substance and Sexual Activity  ? Alcohol use: Not Currently  ? Drug use: Never  ? Sexual activity: Yes  ?     ?Other Topics Concern  ? Not on file  ?Social History Narrative  ? Not on file  ? ?Environmental and  Social history ? ?Lives in a house with a dry environment, dogs and cats located inside the household, no carpet in the bedroom, plastic on the bed, no plastic on the pillow, no smoking ongoing with inside the household.  He works in an office setting as a Audiological scientist. ? ?Objective:  ? ?Vitals:  ? 08/29/21 1426  ?BP: 124/82  ?Pulse: 80  ?Resp: 16  ?Temp: (!) 97.1 ?F (36.2 ?C)  ?SpO2: 95%  ? ?Height: 6' (182.9 cm) ?Weight: 265 lb (120.2 kg) ? ?Physical Exam ?Constitutional:   ?   Appearance: He is not diaphoretic.  ?HENT:  ?   Head: Normocephalic.  ?   Right Ear: Tympanic membrane, ear canal and external ear normal.  ?   Left Ear: Tympanic membrane, ear canal and external ear normal.  ?   Nose: Nose normal. No mucosal edema or rhinorrhea.  ?   Mouth/Throat:  ?   Pharynx: Uvula midline. No oropharyngeal exudate.  ?Eyes:  ?   Conjunctiva/sclera: Conjunctivae normal.  ?Neck:  ?   Thyroid: No thyromegaly.  ?   Trachea: Trachea normal. No tracheal tenderness or tracheal deviation.  ?Cardiovascular:  ?   Rate and Rhythm: Normal rate and regular rhythm.  ?   Heart sounds: Normal heart sounds, S1 normal and S2 normal. No murmur heard. ?Pulmonary:  ?   Effort: No respiratory distress.  ?   Breath sounds: Normal breath sounds. No stridor. No wheezing or rales.  ?Lymphadenopathy:  ?   Head:  ?   Right side of head: No tonsillar  adenopathy.  ?   Left side of head: No tonsillar adenopathy.  ?   Cervical: No cervical adenopathy.  ?Skin: ?   Findings: No erythema or rash.  ?   Nails: There is no clubbing.  ?Neurological:  ?   Mental Sta

## 2021-08-30 ENCOUNTER — Encounter: Payer: Self-pay | Admitting: Allergy and Immunology

## 2021-09-07 NOTE — Procedures (Signed)
Lumbosacral Transforaminal Epidural Steroid Injection - Sub-Pedicular Approach with Fluoroscopic Guidance ? ?Patient: Rick Scott      ?Date of Birth: 07/14/1980 ?MRN: [id number] ?PCP: Midge Minium, [place]      ?Visit Date: 08/28/2021 ?  ?Universal Protocol:    ?Date/Time: 08/28/2021 ? ?Consent Given By: the patient ? ?Position: PRONE ? ?Additional Comments: ?Vital signs were monitored before and after the procedure. ?Patient was prepped and draped in the usual sterile fashion. ?The correct patient, procedure, and site was verified. ? ? ?Injection Procedure Details:  ? ?Procedure diagnoses: Lumbar radiculopathy [M54.16]   ? ?Meds Administered:  ?Meds ordered this encounter  ?Medications  ? methylPREDNISolone acetate (DEPO-MEDROL) injection 80 mg  ? ? ?Laterality: Left ? ?Location/Site: L4 ? ?Needle:5.0 in., 22 ga.  Short bevel or Quincke spinal needle ? ?Needle Placement: Transforaminal ? ?Findings: ?  ? -Comments: Excellent flow of contrast along the nerve, nerve root and into the epidural space. ? ?Procedure Details: ?After squaring off the end-plates to get a true AP view, the C-arm was positioned so that an oblique view of the foramen as noted above was visualized. The target area is just inferior to the "nose of the scotty dog" or sub pedicular. The soft tissues overlying this structure were infiltrated with 2-3 ml. of 1% Lidocaine without Epinephrine. ? ?The spinal needle was inserted toward the target using a "trajectory" view along the fluoroscope beam.  Under AP and lateral visualization, the needle was advanced so it did not puncture dura and was located close the 6 O'Clock position of the pedical in AP tracterory. Biplanar projections were used to confirm position. Aspiration was confirmed to be negative for CSF and/or blood. A 1-2 ml. volume of Isovue-250 was injected and flow of contrast was noted at each level. Radiographs were obtained for documentation purposes.  ? ?After attaining the  desired flow of contrast documented above, a 0.5 to 1.0 ml test dose of 0.25% Marcaine was injected into each respective transforaminal space.  The patient was observed for 90 seconds post injection.  After no sensory deficits were reported, and normal lower extremity motor function was noted,   the above injectate was administered so that equal amounts of the injectate were placed at each foramen (level) into the transforaminal epidural space. ? ? ?Additional Comments:  ?The patient tolerated the procedure well ?Dressing: 2 x 2 sterile gauze and Band-Aid ?  ? ?Post-procedure details: ?Patient was observed during the procedure. ?Post-procedure instructions were reviewed. ? ?Patient left the clinic in stable condition. ? ?

## 2021-09-07 NOTE — Progress Notes (Signed)
? ?Rick Scott - 41 y.o. male MRN [id number]  Date of birth: 12-Oct-1980 ? ?Office Visit Note: ?Visit Date: 08/28/2021 ?PCP: Midge Minium, [place] ?Referred by: Jessy Oto, MD ? ?Subjective: ?Chief Complaint  ?Patient presents with  ? Lower Back - Pain  ? Left Leg - Pain  ? ?HPI:  Rick Scott is a 41 y.o. male who comes in today for planned repeat Left L4-5  Lumbar Transforaminal epidural steroid injection with fluoroscopic guidance.  The patient has failed conservative care including home exercise, medications, time and activity modification.  This injection will be diagnostic and hopefully therapeutic.  Please see requesting physician notes for further details and justification. Patient received more than 50% pain relief from prior injection.  ? ?Referring: Dr. Basil Dess ? ?ROS Otherwise per HPI. ? ?Assessment & Plan: ?Visit Diagnoses:  ?  ICD-10-CM   ?1. Lumbar radiculopathy  M54.16 XR C-ARM NO REPORT  ?  Epidural Steroid injection  ?  methylPREDNISolone acetate (DEPO-MEDROL) injection 80 mg  ?  ?  ?Plan: No additional findings.  ? ?Meds & Orders:  ?Meds ordered this encounter  ?Medications  ? methylPREDNISolone acetate (DEPO-MEDROL) injection 80 mg  ?  ?Orders Placed This Encounter  ?Procedures  ? XR C-ARM NO REPORT  ? Epidural Steroid injection  ?  ?Follow-up: Return for visit to requesting provider as needed.  ? ?Procedures: ?No procedures performed  ?Lumbosacral Transforaminal Epidural Steroid Injection - Sub-Pedicular Approach with Fluoroscopic Guidance ? ?Patient: Rick Scott      ?Date of Birth: November 20, 1980 ?MRN: [id number] ?PCP: Midge Minium, [place]      ?Visit Date: 08/28/2021 ?  ?Universal Protocol:    ?Date/Time: 08/28/2021 ? ?Consent Given By: the patient ? ?Position: PRONE ? ?Additional Comments: ?Vital signs were monitored before and after the procedure. ?Patient was prepped and draped in the usual sterile fashion. ?The correct patient, procedure, and site was  verified. ? ? ?Injection Procedure Details:  ? ?Procedure diagnoses: Lumbar radiculopathy [M54.16]   ? ?Meds Administered:  ?Meds ordered this encounter  ?Medications  ? methylPREDNISolone acetate (DEPO-MEDROL) injection 80 mg  ? ? ?Laterality: Left ? ?Location/Site: L4 ? ?Needle:5.0 in., 22 ga.  Short bevel or Quincke spinal needle ? ?Needle Placement: Transforaminal ? ?Findings: ?  ? -Comments: Excellent flow of contrast along the nerve, nerve root and into the epidural space. ? ?Procedure Details: ?After squaring off the end-plates to get a true AP view, the C-arm was positioned so that an oblique view of the foramen as noted above was visualized. The target area is just inferior to the "nose of the scotty dog" or sub pedicular. The soft tissues overlying this structure were infiltrated with 2-3 ml. of 1% Lidocaine without Epinephrine. ? ?The spinal needle was inserted toward the target using a "trajectory" view along the fluoroscope beam.  Under AP and lateral visualization, the needle was advanced so it did not puncture dura and was located close the 6 O'Clock position of the pedical in AP tracterory. Biplanar projections were used to confirm position. Aspiration was confirmed to be negative for CSF and/or blood. A 1-2 ml. volume of Isovue-250 was injected and flow of contrast was noted at each level. Radiographs were obtained for documentation purposes.  ? ?After attaining the desired flow of contrast documented above, a 0.5 to 1.0 ml test dose of 0.25% Marcaine was injected into each respective transforaminal space.  The patient was observed for 90 seconds post injection.  After no sensory  deficits were reported, and normal lower extremity motor function was noted,   the above injectate was administered so that equal amounts of the injectate were placed at each foramen (level) into the transforaminal epidural space. ? ? ?Additional Comments:  ?The patient tolerated the procedure well ?Dressing: 2 x 2 sterile  gauze and Band-Aid ?  ? ?Post-procedure details: ?Patient was observed during the procedure. ?Post-procedure instructions were reviewed. ? ?Patient left the clinic in stable condition. ?  ? ?Clinical History: ?No specialty comments available.  ? ? ? ?Objective:  VS:  HT:    WT:   BMI:     BP:111/78  HR:84bpm  TEMP: ( )  RESP:  ?Physical Exam ?Vitals and nursing note reviewed.  ?Constitutional:   ?   General: He is not in acute distress. ?   Appearance: Normal appearance. He is not ill-appearing.  ?HENT:  ?   Head: Normocephalic and atraumatic.  ?   Right Ear: External ear normal.  ?   Left Ear: External ear normal.  ?   Nose: No congestion.  ?Eyes:  ?   Extraocular Movements: Extraocular movements intact.  ?Cardiovascular:  ?   Rate and Rhythm: Normal rate.  ?   Pulses: Normal pulses.  ?Pulmonary:  ?   Effort: Pulmonary effort is normal. No respiratory distress.  ?Abdominal:  ?   General: There is no distension.  ?   Palpations: Abdomen is soft.  ?Musculoskeletal:     ?   General: No tenderness or signs of injury.  ?   Cervical back: Neck supple.  ?   Right lower leg: No edema.  ?   Left lower leg: No edema.  ?   Comments: Patient has good distal strength without clonus.  ?Skin: ?   Findings: No erythema or rash.  ?Neurological:  ?   General: No focal deficit present.  ?   Mental Status: He is alert and oriented to person, place, and time.  ?   Sensory: No sensory deficit.  ?   Motor: No weakness or abnormal muscle tone.  ?   Coordination: Coordination normal.  ?Psychiatric:     ?   Mood and Affect: Mood normal.     ?   Behavior: Behavior normal.  ?  ? ?Imaging: ?No results found. ?

## 2021-09-18 ENCOUNTER — Ambulatory Visit: Payer: Self-pay

## 2021-09-18 ENCOUNTER — Ambulatory Visit: Payer: 59 | Admitting: Physical Medicine and Rehabilitation

## 2021-09-18 ENCOUNTER — Encounter: Payer: Self-pay | Admitting: Physical Medicine and Rehabilitation

## 2021-09-18 VITALS — BP 110/76 | HR 72

## 2021-09-18 DIAGNOSIS — M5416 Radiculopathy, lumbar region: Secondary | ICD-10-CM

## 2021-09-18 MED ORDER — METHYLPREDNISOLONE ACETATE 80 MG/ML IJ SUSP
80.0000 mg | Freq: Once | INTRAMUSCULAR | Status: AC
  Administered 2021-09-18: 80 mg

## 2021-09-18 NOTE — Progress Notes (Signed)
Pt state lower back pain that travels to his left leg. Pt state laying down makes the pain worse. Pt state he takes pain meds to help ease his pain. Pt has hx of inj on 08/28/21 pt helped a little bit more than the lasted inj.  Numeric Pain Rating Scale and Functional Assessment Average Pain 5   In the last MONTH (on 0-10 scale) has pain interfered with the following?  1. General activity like being  able to carry out your everyday physical activities such as walking, climbing stairs, carrying groceries, or moving a chair?  Rating(8)   +Driver, -BT, -Dye Allergies.

## 2021-09-18 NOTE — Patient Instructions (Signed)

## 2021-09-27 NOTE — Progress Notes (Signed)
Rick Scott - 41 y.o. male MRN [id number]  Date of birth: [birth year]  Office Visit Note: Visit Date: 09/18/2021 PCP: Midge Minium, [place] Referred by: Jon Billings, NP  Subjective: Chief Complaint  Patient presents with   Lower Back - Pain   Left Leg - Pain   HPI:  Rick Scott is a 41 y.o. male who comes in today for planned repeat Left L4-5  Lumbar Transforaminal epidural steroid injection with fluoroscopic guidance.  The patient has failed conservative care including home exercise, medications, time and activity modification.  This injection will be diagnostic and hopefully therapeutic.  Please see requesting physician notes for further details and justification. Patient received more than 50% pain relief from prior injection.   Referring: Dr. Basil Dess  ROS Otherwise per HPI.  Assessment & Plan: Visit Diagnoses:    ICD-10-CM   1. Lumbar radiculopathy  M54.16 XR C-ARM NO REPORT    Epidural Steroid injection    methylPREDNISolone acetate (DEPO-MEDROL) injection 80 mg      Plan: No additional findings.   Meds & Orders:  Meds ordered this encounter  Medications   methylPREDNISolone acetate (DEPO-MEDROL) injection 80 mg    Orders Placed This Encounter  Procedures   XR C-ARM NO REPORT   Epidural Steroid injection    Follow-up: Return for visit to requesting provider as needed.   Procedures: No procedures performed  Lumbosacral Transforaminal Epidural Steroid Injection - Sub-Pedicular Approach with Fluoroscopic Guidance  Patient: Rick Scott      Date of Birth: 09-04-1980 MRN: [id number] PCP: Midge Minium, [place]      Visit Date: 09/18/2021   Universal Protocol:    Date/Time: 09/18/2021  Consent Given By: the patient  Position: PRONE  Additional Comments: Vital signs were monitored before and after the procedure. Patient was prepped and draped in the usual sterile fashion. The correct patient, procedure, and site was  verified.   Injection Procedure Details:   Procedure diagnoses: Lumbar radiculopathy [M54.16]    Meds Administered:  Meds ordered this encounter  Medications   methylPREDNISolone acetate (DEPO-MEDROL) injection 80 mg    Laterality: Left  Location/Site: L4  Needle:5.0 in., 22 ga.  Short bevel or Quincke spinal needle  Needle Placement: Transforaminal  Findings:    -Comments: Excellent flow of contrast along the nerve, nerve root and into the epidural space.  Procedure Details: After squaring off the end-plates to get a true AP view, the C-arm was positioned so that an oblique view of the foramen as noted above was visualized. The target area is just inferior to the "nose of the scotty dog" or sub pedicular. The soft tissues overlying this structure were infiltrated with 2-3 ml. of 1% Lidocaine without Epinephrine.  The spinal needle was inserted toward the target using a "trajectory" view along the fluoroscope beam.  Under AP and lateral visualization, the needle was advanced so it did not puncture dura and was located close the 6 O'Clock position of the pedical in AP tracterory. Biplanar projections were used to confirm position. Aspiration was confirmed to be negative for CSF and/or blood. A 1-2 ml. volume of Isovue-250 was injected and flow of contrast was noted at each level. Radiographs were obtained for documentation purposes.   After attaining the desired flow of contrast documented above, a 0.5 to 1.0 ml test dose of 0.25% Marcaine was injected into each respective transforaminal space.  The patient was observed for 90 seconds post injection.  After no sensory deficits  were reported, and normal lower extremity motor function was noted,   the above injectate was administered so that equal amounts of the injectate were placed at each foramen (level) into the transforaminal epidural space.   Additional Comments:  The patient tolerated the procedure well Dressing: 2 x 2 sterile  gauze and Band-Aid    Post-procedure details: Patient was observed during the procedure. Post-procedure instructions were reviewed.  Patient left the clinic in stable condition.    Clinical History: No specialty comments available.     Objective:  VS:  HT:    WT:   BMI:     BP:110/76  HR:72bpm  TEMP: ( )  RESP:  Physical Exam Vitals and nursing note reviewed.  Constitutional:      General: He is not in acute distress.    Appearance: Normal appearance. He is not ill-appearing.  HENT:     Head: Normocephalic and atraumatic.     Right Ear: External ear normal.     Left Ear: External ear normal.     Nose: No congestion.  Eyes:     Extraocular Movements: Extraocular movements intact.  Cardiovascular:     Rate and Rhythm: Normal rate.     Pulses: Normal pulses.  Pulmonary:     Effort: Pulmonary effort is normal. No respiratory distress.  Abdominal:     General: There is no distension.     Palpations: Abdomen is soft.  Musculoskeletal:        General: No tenderness or signs of injury.     Cervical back: Neck supple.     Right lower leg: No edema.     Left lower leg: No edema.     Comments: Patient has good distal strength without clonus.  Skin:    Findings: No erythema or rash.  Neurological:     General: No focal deficit present.     Mental Status: He is alert and oriented to person, place, and time.     Sensory: No sensory deficit.     Motor: No weakness or abnormal muscle tone.     Coordination: Coordination normal.  Psychiatric:        Mood and Affect: Mood normal.        Behavior: Behavior normal.     Imaging: No results found.

## 2021-09-27 NOTE — Procedures (Signed)
Lumbosacral Transforaminal Epidural Steroid Injection - Sub-Pedicular Approach with Fluoroscopic Guidance  Patient: [place]      Date of Birth: February 01, 1981 MRN: [id number] PCP: Midge Minium, PA      Visit Date: 09/18/2021   Universal Protocol:    Date/Time: 09/18/2021  Consent Given By: the patient  Position: PRONE  Additional Comments: Vital signs were monitored before and after the procedure. Patient was prepped and draped in the usual sterile fashion. The correct patient, procedure, and site was verified.   Injection Procedure Details:   Procedure diagnoses: Lumbar radiculopathy [M54.16]    Meds Administered:  Meds ordered this encounter  Medications   methylPREDNISolone acetate (DEPO-MEDROL) injection 80 mg    Laterality: Left  Location/Site: L4  Needle:5.0 in., 22 ga.  Short bevel or Quincke spinal needle  Needle Placement: Transforaminal  Findings:    -Comments: Excellent flow of contrast along the nerve, nerve root and into the epidural space.  Procedure Details: After squaring off the end-plates to get a true AP view, the C-arm was positioned so that an oblique view of the foramen as noted above was visualized. The target area is just inferior to the "nose of the scotty dog" or sub pedicular. The soft tissues overlying this structure were infiltrated with 2-3 ml. of 1% Lidocaine without Epinephrine.  The spinal needle was inserted toward the target using a "trajectory" view along the fluoroscope beam.  Under AP and lateral visualization, the needle was advanced so it did not puncture dura and was located close the 6 O'Clock position of the pedical in AP tracterory. Biplanar projections were used to confirm position. Aspiration was confirmed to be negative for CSF and/or blood. A 1-2 ml. volume of Isovue-250 was injected and flow of contrast was noted at each level. Radiographs were obtained for documentation purposes.   After attaining the  desired flow of contrast documented above, a 0.5 to 1.0 ml test dose of 0.25% Marcaine was injected into each respective transforaminal space.  The patient was observed for 90 seconds post injection.  After no sensory deficits were reported, and normal lower extremity motor function was noted,   the above injectate was administered so that equal amounts of the injectate were placed at each foramen (level) into the transforaminal epidural space.   Additional Comments:  The patient tolerated the procedure well Dressing: 2 x 2 sterile gauze and Band-Aid    Post-procedure details: Patient was observed during the procedure. Post-procedure instructions were reviewed.  Patient left the clinic in stable condition.

## 2021-10-26 ENCOUNTER — Other Ambulatory Visit (INDEPENDENT_AMBULATORY_CARE_PROVIDER_SITE_OTHER): Payer: 59

## 2021-10-26 ENCOUNTER — Ambulatory Visit (INDEPENDENT_AMBULATORY_CARE_PROVIDER_SITE_OTHER): Payer: 59

## 2021-10-26 ENCOUNTER — Ambulatory Visit: Payer: 59 | Admitting: Pulmonary Disease

## 2021-10-26 ENCOUNTER — Encounter: Payer: Self-pay | Admitting: Pulmonary Disease

## 2021-10-26 VITALS — BP 126/64 | HR 79 | Ht 72.0 in | Wt 268.6 lb

## 2021-10-26 DIAGNOSIS — J4540 Moderate persistent asthma, uncomplicated: Secondary | ICD-10-CM

## 2021-10-26 LAB — CBC WITH DIFFERENTIAL/PLATELET
Basophils Absolute: 0.1 10*3/uL (ref 0.0–0.1)
Basophils Relative: 1.2 % (ref 0.0–3.0)
Eosinophils Absolute: 0.5 10*3/uL (ref 0.0–0.7)
Eosinophils Relative: 7.3 % — ABNORMAL HIGH (ref 0.0–5.0)
HCT: 41.3 % (ref 39.0–52.0)
Hemoglobin: 13.6 g/dL (ref 13.0–17.0)
Lymphocytes Relative: 31.8 % (ref 12.0–46.0)
Lymphs Abs: 2 10*3/uL (ref 0.7–4.0)
MCHC: 33 g/dL (ref 30.0–36.0)
MCV: 91.6 fl (ref 78.0–100.0)
Monocytes Absolute: 0.5 10*3/uL (ref 0.1–1.0)
Monocytes Relative: 8 % (ref 3.0–12.0)
Neutro Abs: 3.3 10*3/uL (ref 1.4–7.7)
Neutrophils Relative %: 51.7 % (ref 43.0–77.0)
Platelets: 207 10*3/uL (ref 150.0–400.0)
RBC: 4.51 Mil/uL (ref 4.22–5.81)
RDW: 14.7 % (ref 11.5–15.5)
WBC: 6.4 10*3/uL (ref 4.0–10.5)

## 2021-10-26 NOTE — Progress Notes (Signed)
[place]    [id number]    Oct 06, 1980  Primary Care Physician:Owens, Scarlette Calico, PA  Referring Physician: Midge Minium, [address] [place],  [place] [address]  Chief complaint: Consult for asthma  HPI: 41 y.o. with history of allergies, GERD Complains of dyspnea, wheezing and " catching" sensation in the center of the chest that is intermittent with no specific aggravating or relieving factors.  He was started on breo by his primary care 2 weeks ago with some improvement in symptoms  History notable for severe GERD.  Has been on multiple PPIs in the past without any improvement and is currently just taking TUMS.  He was also evaluated by Dr. Estanislado Pandy, rheumatology for joint pain with extensive ILD panel positive only for borderline nonspecific ANA elevation.  Evaluated by Dr. Neldon Mc April for atopic dermatitis and allergies.  Immunotherapy recommended but patient did not go through due to insurance cost.  He also recently had an MRI in June 2023 for memory loss which showed mild sinusitis and has been referred to ENT for  Pets: Cats, dog Occupation: [employer] Exposures: Lives in a damp house in [place] from 20 10-20 16.  He has mattress pad that may have done in it but he is unsure. Smoking history: Never Travel history: No significant travel history Relevant family history: Mother had COPD, asthma.  She was a smoker.   Outpatient Encounter Medications as of 10/26/2021  Medication Sig   albuterol (VENTOLIN HFA) 108 (90 Base) MCG/ACT inhaler 2 inhalations every 4 - 6 hours as needed.   amoxicillin-clavulanate (AUGMENTIN) 875-125 MG tablet Take by mouth.   B Complex-C (B COMPLEX-VITAMIN C) CAPS    BREO ELLIPTA 100-25 MCG/ACT AEPB Inhale 1 puff into the lungs daily.   Cetirizine HCl 10 MG CAPS    fluticasone (FLONASE) 50 MCG/ACT nasal spray Place into both nostrils daily.   Ibuprofen 200 MG  CAPS Take by mouth as needed.   LINZESS 72 MCG capsule Take 72 mcg by mouth every morning.   metFORMIN (GLUCOPHAGE-XR) 500 MG 24 hr tablet Take 500 mg by mouth daily.   Olopatadine-Mometasone (RYALTRIS) [id number] MCG/ACT SUSP Place 2 sprays into the nose 2 (two) times daily.   calcium carbonate (TUMS - DOSED IN MG ELEMENTAL CALCIUM) 500 MG chewable tablet Chew 1 tablet by mouth as needed for indigestion or heartburn. (Patient not taking: Reported on 10/26/2021)   Diclofenac-miSOPROStol 50-0.2 MG TBEC TAKE 1 TABLET BY MOUTH TWICE  DAILY AT 12 NOON AND 4 PM (Patient not taking: Reported on 10/26/2021)   EPINEPHrine (EPIPEN 2-PAK) 0.3 mg/0.3 mL IJ SOAJ injection Inject 0.3 mg into the muscle as needed for anaphylaxis. (Patient not taking: Reported on 10/26/2021)   ketoconazole (NIZORAL) 2 % cream Apply to aa's rash QHS. (Patient not taking: Reported on 10/26/2021)   Olopatadine HCl (PATADAY) 0.2 % SOLN Place 1 drop in each eye 1 time per day. (Patient not taking: Reported on 10/26/2021)   omeprazole (PRILOSEC) 40 MG capsule Take 1 capsule (40 mg total) by mouth every morning. (Patient not taking: Reported on 10/26/2021)   Ruxolitinib Phosphate (OPZELURA) 1.5 % CREA Apply 1 application. topically 2 (two) times daily. Bid aa eczema until clear, then prn flares (Patient not taking: Reported on 10/26/2021)   [DISCONTINUED] azelastine (ASTELIN) 0.1 % nasal spray    [DISCONTINUED] clindamycin (CLEOCIN-T) 1 % lotion Apply topically as directed. Qd to  bid aa cyst R axilla and R groin   [DISCONTINUED] ZYRTEC ALLERGY 10 MG tablet    No facility-administered encounter medications on file as of 10/26/2021.    Allergies as of 10/26/2021 - Review Complete 10/26/2021  Allergen Reaction Noted   Avocado Swelling 07/04/2018   Banana Swelling 05/18/2015   Corylus Swelling 05/18/2015   Orange oil Nausea And Vomiting 05/18/2015   Doxycycline Rash 12/05/2020   Codeine Nausea And Vomiting 06/13/2015   Hazelnut (filbert)  allergy skin test  07/18/2020   Other Other (See Comments) 05/18/2015   Bupropion Other (See Comments) 12/06/2016    Past Medical History:  Diagnosis Date   Allergy bananas, oranges, melons   Asthma 1996   Well controlled   Chronic diarrhea    has improved recently as diet has improved   Colon polyps    Eczema    GERD (gastroesophageal reflux disease)     Past Surgical History:  Procedure Laterality Date   FINGER SURGERY Right    5th digit   GANGLION CYST EXCISION      Family History  Problem Relation Age of Onset   COPD Mother    Diabetes Mother    Cancer Mother    Heart disease Mother    Obesity Mother    Healthy Father    Varicose Veins Father    Cancer Maternal Grandmother    Healthy Daughter    ADD / ADHD Daughter     Social History   Socioeconomic History   Marital status: Married    Spouse name: Not on file   Number of children: Not on file   Years of education: Not on file   Highest education level: Not on file  Occupational History   Not on file  Tobacco Use   Smoking status: Never    Passive exposure: Past   Smokeless tobacco: Never  Vaping Use   Vaping Use: Never used  Substance and Sexual Activity   Alcohol use: Not Currently   Drug use: Never   Sexual activity: Yes    Birth control/protection: I.U.D.  Other Topics Concern   Not on file  Social History Narrative   Not on file   Social Determinants of Health   Financial Resource Strain: Not on file  Food Insecurity: Not on file  Transportation Needs: Not on file  Physical Activity: Not on file  Stress: Not on file  Social Connections: Not on file  Intimate Partner Violence: Not on file    Review of systems: Review of Systems  Constitutional: Negative for fever and chills.  HENT: Negative.   Eyes: Negative for blurred vision.  Respiratory: as per HPI  Cardiovascular: Negative for chest pain and palpitations.  Gastrointestinal: Negative for vomiting, diarrhea, blood per  rectum. Genitourinary: Negative for dysuria, urgency, frequency and hematuria.  Musculoskeletal: Negative for myalgias, back pain and joint pain.  Skin: Negative for itching and rash.  Neurological: Negative for dizziness, tremors, focal weakness, seizures and loss of consciousness.  Endo/Heme/Allergies: Negative for environmental allergies.  Psychiatric/Behavioral: Negative for depression, suicidal ideas and hallucinations.  All other systems reviewed and are negative.  Physical Exam: Blood pressure 126/64, pulse 79, height 6' (1.829 m), weight 268 lb 9.6 oz (121.8 kg), SpO2 97 %. Gen:      No acute distress HEENT:  EOMI, sclera anicteric Neck:     No masses; no thyromegaly Lungs:    Clear to auscultation bilaterally; normal respiratory effort CV:  Regular rate and rhythm; no murmurs Abd:      + bowel sounds; soft, non-tender; no palpable masses, no distension Ext:    No edema; adequate peripheral perfusion Skin:      Warm and dry; no rash Neuro: alert and oriented x 3 Psych: normal mood and affect  Data Reviewed: Imaging:  PFTs: Spirometry 08/29/2021 FVC 4.46 [80%], FEV1 3.64 [82%], F/F 82 Normal test.  Labs:  Assessment:  Asthma He has history of allergies, sinusitis and asthma Has been on Breo for 2 weeks and will continue the same as he is already seeing some response with that Check CBC with differential, IgE Get chest x-ray and PFTs.  If there is any abnormality on this then consider high-res CT as there may be exposure to mold and feather pillows.  Plan/Recommendations: Continue breo CBC, IgE Chest x-ray, PFTs  Marshell Garfinkel MD [facility] 10/26/2021, 11:46 AM  CC: Midge Minium, PA

## 2021-10-26 NOTE — Patient Instructions (Signed)
We will get a chest x-ray today Check CBC differential, IgE Continue the breo Schedule PFTs and follow-up in 3 months

## 2021-10-27 LAB — IGE: IgE (Immunoglobulin E), Serum: 775 kU/L — ABNORMAL HIGH (ref <=114)

## 2021-11-30 ENCOUNTER — Other Ambulatory Visit: Payer: Self-pay

## 2021-11-30 MED ORDER — RYALTRIS 665-25 MCG/ACT NA SUSP: 2.0000 | Freq: Two times a day (BID) | NASAL | 1 refills | Status: AC

## 2022-02-05 ENCOUNTER — Encounter: Payer: Self-pay | Admitting: Pulmonary Disease

## 2022-02-05 ENCOUNTER — Ambulatory Visit: Payer: 59 | Admitting: Pulmonary Disease

## 2022-02-05 VITALS — BP 120/82 | HR 72 | Temp 97.9°F | Ht 72.0 in | Wt 262.0 lb

## 2022-02-05 DIAGNOSIS — J4540 Moderate persistent asthma, uncomplicated: Secondary | ICD-10-CM

## 2022-02-05 LAB — PULMONARY FUNCTION TEST
DL/VA % pred: 101 %
DL/VA: 4.62 ml/min/mmHg/L
DLCO cor % pred: 90 %
DLCO cor: 29.41 ml/min/mmHg
DLCO unc % pred: 90 %
DLCO unc: 29.41 ml/min/mmHg
FEF 25-75 Post: 3.83 L/sec
FEF 25-75 Pre: 3.94 L/sec
FEF2575-%Change-Post: -2 %
FEF2575-%Pred-Post: 95 %
FEF2575-%Pred-Pre: 98 %
FEV1-%Change-Post: 0 %
FEV1-%Pred-Post: 90 %
FEV1-%Pred-Pre: 90 %
FEV1-Post: 3.98 L
FEV1-Pre: 3.99 L
FEV1FVC-%Change-Post: -1 %
FEV1FVC-%Pred-Pre: 103 %
FEV6-%Change-Post: 1 %
FEV6-%Pred-Post: 91 %
FEV6-%Pred-Pre: 90 %
FEV6-Post: 4.95 L
FEV6-Pre: 4.89 L
FEV6FVC-%Pred-Post: 102 %
FEV6FVC-%Pred-Pre: 102 %
FVC-%Change-Post: 1 %
FVC-%Pred-Post: 88 %
FVC-%Pred-Pre: 87 %
FVC-Post: 4.95 L
FVC-Pre: 4.89 L
Post FEV1/FVC ratio: 80 %
Post FEV6/FVC ratio: 100 %
Pre FEV1/FVC ratio: 82 %
Pre FEV6/FVC Ratio: 100 %
RV % pred: 121 %
RV: 2.43 L
TLC % pred: 109 %
TLC: 8.02 L

## 2022-02-05 LAB — NITRIC OXIDE: Nitric Oxide: 9

## 2022-02-05 MED ORDER — MONTELUKAST SODIUM 10 MG PO TABS: 10.0000 mg | ORAL_TABLET | Freq: Every day | ORAL | 11 refills | Status: DC

## 2022-02-05 MED ORDER — MONTELUKAST SODIUM 10 MG PO TABS: 10.0000 mg | ORAL_TABLET | Freq: Every day | ORAL | 3 refills | Status: DC

## 2022-02-05 MED ORDER — FLUTICASONE FUROATE-VILANTEROL 200-25 MCG/ACT IN AEPB: 1.0000 | INHALATION_SPRAY | Freq: Every day | RESPIRATORY_TRACT | 3 refills | Status: DC

## 2022-02-05 MED ORDER — FLUTICASONE FUROATE-VILANTEROL 200-25 MCG/ACT IN AEPB: 1.0000 | INHALATION_SPRAY | Freq: Every day | RESPIRATORY_TRACT | 2 refills | Status: DC

## 2022-02-05 NOTE — Progress Notes (Unsigned)
[place]    [id number]    11-24-1980  Primary Care Physician:Owens, Scarlette Calico, PA  Referring Physician: Midge Minium, [place],  [place] [address]  Chief complaint: Follow-up for asthma  HPI: 41 y.o. with history of allergies, GERD Complains of dyspnea, wheezing and " catching" sensation in the center of the chest that is intermittent with no specific aggravating or relieving factors.  He was started on breo by his primary care 2 weeks ago with some improvement in symptoms  History notable for severe GERD.  Has been on multiple PPIs in the past without any improvement and is currently just taking TUMS.  He was also evaluated by Dr. Estanislado Pandy, rheumatology for joint pain with extensive ILD panel positive only for borderline nonspecific ANA elevation.  Evaluated by Dr. Neldon Mc April for atopic dermatitis and allergies.  Immunotherapy recommended but patient did not go through due to insurance cost.  He also recently had an MRI in June 2023 for memory loss which showed mild sinusitis and has been referred to ENT for  Pets: Cats, dog Occupation: [employer] Exposures: Lives in a damp house in [place] from 20 10-20 16.  He has mattress pad that may have done in it but he is unsure. Smoking history: Never Travel history: No significant travel history Relevant family history: Mother had COPD, asthma.  She was a smoker.  Interim history: Here for review of PFTs.  He is on Breo now and has not noted any change in symptoms   Outpatient Encounter Medications as of 02/05/2022  Medication Sig   albuterol (VENTOLIN HFA) 108 (90 Base) MCG/ACT inhaler 2 inhalations every 4 - 6 hours as needed.   BREO ELLIPTA 100-25 MCG/ACT AEPB Inhale 1 puff into the lungs daily.   Cetirizine HCl 10 MG CAPS    fluticasone (FLONASE) 50 MCG/ACT nasal spray Place into both nostrils daily.   Ibuprofen 200 MG CAPS  Take by mouth as needed.   LINZESS 72 MCG capsule Take 72 mcg by mouth every morning.   metFORMIN (GLUCOPHAGE) 500 MG tablet    Olopatadine-Mometasone (RYALTRIS) 665-25 MCG/ACT SUSP Place 2 sprays into the nose 2 (two) times daily.   phentermine (ADIPEX-P) 37.5 MG tablet Take by mouth.   Ruxolitinib Phosphate (OPZELURA) 1.5 % CREA Apply 1 application. topically 2 (two) times daily. Bid aa eczema until clear, then prn flares   calcium carbonate (TUMS - DOSED IN MG ELEMENTAL CALCIUM) 500 MG chewable tablet Chew 1 tablet by mouth as needed for indigestion or heartburn. (Patient not taking: Reported on 10/26/2021)   Diclofenac-miSOPROStol 50-0.2 MG TBEC TAKE 1 TABLET BY MOUTH TWICE  DAILY AT 12 NOON AND 4 PM (Patient not taking: Reported on 10/26/2021)   EPINEPHrine (EPIPEN 2-PAK) 0.3 mg/0.3 mL IJ SOAJ injection Inject 0.3 mg into the muscle as needed for anaphylaxis. (Patient not taking: Reported on 10/26/2021)   ketoconazole (NIZORAL) 2 % cream Apply to aa's rash QHS. (Patient not taking: Reported on 10/26/2021)   [DISCONTINUED] azelastine (ASTELIN) 0.1 % nasal spray    [DISCONTINUED] B Complex-C (B COMPLEX-VITAMIN C) CAPS    [DISCONTINUED] Olopatadine HCl (PATADAY) 0.2 % SOLN Place 1 drop in each eye 1 time per day. (Patient not taking: Reported on 10/26/2021)   [DISCONTINUED] omeprazole (PRILOSEC) 40 MG capsule Take 1 capsule (40 mg total) by mouth every morning. (Patient not taking: Reported on 10/26/2021)  No facility-administered encounter medications on file as of 02/05/2022.   Physical Exam: Blood pressure 120/82, pulse 72, temperature 97.9 F (36.6 C), temperature source Oral, height 6' (1.829 m), weight 262 lb (118.8 kg), SpO2 97 %. Gen:      No acute distress HEENT:  EOMI, sclera anicteric Neck:     No masses; no thyromegaly Lungs:    Clear to auscultation bilaterally; normal respiratory effort= CV:         Regular rate and rhythm; no murmurs Abd:      + bowel sounds; soft, non-tender; no  palpable masses, no distension Ext:    No edema; adequate peripheral perfusion Skin:      Warm and dry; no rash Neuro: alert and oriented x 3 Psych: normal mood and affect   Data Reviewed: Imaging: Chest x-ray 10/26/2021-no active cardiopulmonary disease.  I have reviewed the images personally.  PFTs: Spirometry 08/29/2021 FVC 4.46 [80%], FEV1 3.64 [82%], F/F 82 Normal test.  02/05/2022 FVC 4.95 [88%], FEV1 3.98 [90%], F/F 80, TLC 8.02 [109%], DLCO 29.41 [90%] Lung function test is normal Flattening of inspiratory limb suggestive of variable extrathoracic obstruction  FENO 02/05/2022- 9  Labs: CBC 10/26/2021-WBC 6.4, eos 7.3%, absolute eosinophil count 467 IgE 10/26/2021-775  Assessment:  Asthma He has history of allergies, sinusitis and asthma Has peripheral eosinophilia and elevated IgE which is consistent with presentation though the FENO is low He has questionable exposure to mold and feather pillows but no evidence of ILD on chest x-ray and PFTs Flattening of inspiratory limb suggestive of variable extrathoracic obstruction but no symptoms  Will increase Breo to 200 and start Singulair If symptoms continue then consider ENT consult and/or high-resolution CT  Plan/Recommendations: Increase Breo to 200 Start Singulair  Marshell Garfinkel MD [facility] 02/05/2022, 4:14 PM  CC: Midge Minium, PA

## 2022-02-05 NOTE — Progress Notes (Signed)
Full PFT performed today. °

## 2022-02-05 NOTE — Patient Instructions (Signed)
Full PFT performed today. °

## 2022-02-05 NOTE — Patient Instructions (Signed)
Your lab test show increase in inflammation consistent with asthma and allergies Your lung function tests are normal which is good news We will increase the breo dose to 200 Start Singulair 10 mg Follow-up in 3 months to review. We can do a video visit or in person visit

## 2022-02-13 ENCOUNTER — Other Ambulatory Visit: Payer: Self-pay | Admitting: Family Medicine

## 2022-02-13 DIAGNOSIS — R053 Chronic cough: Secondary | ICD-10-CM

## 2022-02-13 DIAGNOSIS — R7303 Prediabetes: Secondary | ICD-10-CM

## 2022-02-19 ENCOUNTER — Ambulatory Visit
Admission: RE | Admit: 2022-02-19 | Discharge: 2022-02-19 | Disposition: A | Discharge status: Home / Self Care | Payer: 59 | Source: Ambulatory Visit | Attending: Family Medicine | Admitting: Family Medicine

## 2022-02-19 DIAGNOSIS — R7303 Prediabetes: Secondary | ICD-10-CM

## 2022-02-19 DIAGNOSIS — R053 Chronic cough: Secondary | ICD-10-CM

## 2022-02-26 ENCOUNTER — Encounter (INDEPENDENT_AMBULATORY_CARE_PROVIDER_SITE_OTHER): Payer: Self-pay

## 2022-02-26 ENCOUNTER — Other Ambulatory Visit: Payer: Self-pay | Admitting: Family Medicine

## 2022-02-26 DIAGNOSIS — R053 Chronic cough: Secondary | ICD-10-CM

## 2022-02-26 DIAGNOSIS — R7303 Prediabetes: Secondary | ICD-10-CM

## 2022-04-20 ENCOUNTER — Encounter: Payer: Self-pay | Admitting: Pulmonary Disease

## 2022-04-20 MED ORDER — FLUTICASONE FUROATE-VILANTEROL 200-25 MCG/ACT IN AEPB: 1.0000 | INHALATION_SPRAY | Freq: Every day | RESPIRATORY_TRACT | 3 refills | Status: DC

## 2022-05-25 ENCOUNTER — Encounter: Payer: Self-pay | Admitting: Pulmonary Disease

## 2022-05-25 ENCOUNTER — Telehealth (INDEPENDENT_AMBULATORY_CARE_PROVIDER_SITE_OTHER): Payer: 59 | Admitting: Pulmonary Disease

## 2022-05-25 DIAGNOSIS — J4540 Moderate persistent asthma, uncomplicated: Secondary | ICD-10-CM | POA: Diagnosis not present

## 2022-05-25 NOTE — Progress Notes (Signed)
Rick Scott    [id number]    [birth year]  Primary Care Physician:Owens, Scarlette Calico, PA  Referring Physician: Midge [place], [place],  [place] [phone]  Virtual Visit via Video Note  I connected with Rick Scott on 05/25/22 at  3:45 PM EST by a video enabled telemedicine application and verified that I am speaking with the correct person using two identifiers.  Location: Patient: Home Provider: [address]   I discussed the limitations of evaluation and management by telemedicine and the availability of in person appointments. The patient expressed understanding and agreed to proceed.  Chief complaint: Follow-up for asthma  HPI: 42 y.o. with history of allergies, GERD Complains of dyspnea, wheezing and " catching" sensation in the center of the chest that is intermittent with no specific aggravating or relieving factors.  He was started on breo by his primary care 2 weeks ago with some improvement in symptoms  History notable for severe GERD.  Has been on multiple PPIs in the past without any improvement and is currently just taking TUMS.  He was also evaluated by Dr. Estanislado Scott, rheumatology for joint pain with extensive ILD panel positive only for borderline nonspecific ANA elevation.  Evaluated by Dr. Neldon Scott April for atopic dermatitis and allergies.  Immunotherapy recommended but patient did not go through due to insurance cost.  He also recently had an MRI in June 2023 for memory loss which showed mild sinusitis and has been referred to ENT for  Pets: Cats, dog Occupation: [place] Exposures: Lives in a damp house in [place] from 20 10-20 16.  He has mattress pad that may have done in it but he is unsure. Smoking history: Never Travel history: No significant travel history Relevant family history: Mother had COPD, asthma.  She was a smoker.  Interim history: At last  visit Breo dose was increased and she will start her on Singulair.  Overall he feels that his breathing is improved.  His memory issues have also improved as his stress levels are getting better.  Outpatient Encounter Medications as of 05/25/2022  Medication Sig   albuterol (VENTOLIN HFA) 108 (90 Base) MCG/ACT inhaler 2 inhalations every 4 - 6 hours as needed.   calcium carbonate (TUMS - DOSED IN MG ELEMENTAL CALCIUM) 500 MG chewable tablet Chew 1 tablet by mouth as needed for indigestion or heartburn.   Cetirizine HCl 10 MG CAPS    Diclofenac-miSOPROStol 50-0.2 MG TBEC TAKE 1 TABLET BY MOUTH TWICE  DAILY AT 12 NOON AND 4 PM   EPINEPHrine (EPIPEN 2-PAK) 0.3 mg/0.3 mL IJ SOAJ injection Inject 0.3 mg into the muscle as needed for anaphylaxis.   fluticasone (FLONASE) 50 MCG/ACT nasal spray Place into both nostrils daily.   fluticasone furoate-vilanterol (BREO ELLIPTA) 200-25 MCG/ACT AEPB Inhale 1 puff into the lungs daily.   Ibuprofen 200 MG CAPS Take by mouth as needed.   ketoconazole (NIZORAL) 2 % cream Apply to aa's rash QHS.   LINZESS 72 MCG capsule Take 72 mcg by mouth every morning.   metFORMIN (GLUCOPHAGE-XR) 750 MG 24 hr tablet Take by mouth.   montelukast (SINGULAIR) 10 MG tablet Take 1 tablet (10 mg total) by mouth at bedtime.   Olopatadine-Mometasone (RYALTRIS) [id number] MCG/ACT SUSP Place 2 sprays into the nose 2 (two) times daily.   Ruxolitinib Phosphate (OPZELURA) 1.5 % CREA Apply 1 application. topically 2 (two) times daily. Bid aa eczema until clear,  then prn flares   [DISCONTINUED] azelastine (ASTELIN) 0.1 % nasal spray    [DISCONTINUED] metFORMIN (GLUCOPHAGE) 500 MG tablet    No facility-administered encounter medications on file as of 05/25/2022.   Physical Exam: Virtual  Data Reviewed: Imaging: Chest x-ray 10/26/2021-no active cardiopulmonary disease.  I have reviewed the images personally.  PFTs: Spirometry 08/29/2021 FVC 4.46 [80%], FEV1 3.64 [82%], F/F 82 Normal  test.  02/05/2022 FVC 4.95 [88%], FEV1 3.98 [90%], F/F 80, TLC 8.02 [109%], DLCO 29.41 [90%] Lung function test is normal Flattening of inspiratory limb suggestive of variable extrathoracic obstruction  FENO 02/05/2022- 9  Labs: CBC 10/26/2021-WBC 6.4, eos 7.3%, absolute eosinophil count 467 IgE 10/26/2021-775  Assessment:  Asthma He has history of allergies, sinusitis and asthma Has peripheral eosinophilia and elevated IgE which is consistent with presentation though the FENO is low He has questionable exposure to mold and feather pillows but no evidence of ILD on chest x-ray and PFTs Flattening of inspiratory limb suggestive of variable extrathoracic obstruction but no symptoms  Symptoms are better on Breo 200 and Singulair.  Will continue the same  Plan/Recommendations: Continue Breo 200, Singulair    I discussed the assessment and treatment plan with the patient. The patient was provided an opportunity to ask questions and all were answered. The patient agreed with the plan and demonstrated an understanding of the instructions.   The patient was advised to call back or seek an in-person evaluation if the symptoms worsen or if the condition fails to improve as anticipated.  I provided 35 minutes of non-face-to-face time during this encounter.  Marshell Garfinkel MD [facility] 05/25/2022, 4:17 PM  CC: Rick Minium, PA

## 2022-05-25 NOTE — Patient Instructions (Signed)
I am glad you are doing better with your breathing Continue the Breo and Singulair Follow-up in 6 months

## 2022-06-27 ENCOUNTER — Encounter: Payer: Self-pay | Admitting: Physical Therapy

## 2022-06-27 ENCOUNTER — Ambulatory Visit: Payer: 59 | Attending: Family Medicine | Admitting: Physical Therapy

## 2022-06-27 DIAGNOSIS — M533 Sacrococcygeal disorders, not elsewhere classified: Secondary | ICD-10-CM | POA: Insufficient documentation

## 2022-06-27 DIAGNOSIS — M5459 Other low back pain: Secondary | ICD-10-CM | POA: Diagnosis present

## 2022-06-27 NOTE — Therapy (Unsigned)
OUTPATIENT PHYSICAL THERAPY EVALUATION   Patient Name: Rick Scott MRN: [id number] DOB:[birth year], 42 y.o., male Today's Date: 06/27/2022   PT End of Session - 06/27/22 1622     Visit Number 1    Number of Visits 10    Date for PT Re-Evaluation 09/05/22    Authorization Type 60 max    PT Start Time 1623    PT Stop Time 1710    PT Time Calculation (min) 47 min             Past Medical History:  Diagnosis Date   Allergy bananas, oranges, melons   Asthma 1996   Well controlled   Chronic diarrhea    has improved recently as diet has improved   Colon polyps    Eczema    GERD (gastroesophageal reflux disease)    Past Surgical History:  Procedure Laterality Date   FINGER SURGERY Right    5th digit   GANGLION CYST EXCISION     Patient Active Problem List   Diagnosis Date Noted   OSA (obstructive sleep apnea) 07/01/2019   Asthma, moderate persistent 04/28/2019   BMI 34.0-34.9,adult 04/28/2019   Abdominal pain with radiation to back 11/12/2017   Testicular pain, right 11/11/2017   Family history of colonic polyps 06/17/2015   Chronic diarrhea 05/18/2015   Low back pain, unspecified 05/18/2015   Joint pain 04/30/2004   Asymptomatic varicose veins of unspecified lower extremity 04/30/1998   Irritable bowel syndrome with constipation 08/29/1994   Gastric reflux 04/30/1994   Asthma due to environmental allergies 04/30/1990   Allergies 22-Oct-1978    PCP: Midge Minium PA  REFERRING PROVIDER: Midge Minium PA  REFERRING DIAG:  K59.09 (ICD-10-CM) - Other constipation  M54.50 (ICD-10-CM) - Low back pain, unspecified  G89.29 (ICD-10-CM) - Other chronic pain   Rationale for Evaluation and Treatment Rehabilitation  THERAPY DIAG:  Other low back pain  Sacrococcygeal disorders, not elsewhere classified  ONSET DATE:   SUBJECTIVE:                                                                                                                                                                                            SUBJECTIVE STATEMENT: 1) constipation / fecal seepage:    Pt  has been an issue since he was 29-68 years old. Pt had anxiety of embarrassment as a childhood with smell, odor sound. Pt will use public restroom when he has the urge for bowel movement. When he has the urge, nothing happens.  Pt takes Linzess for 7 years . Pt will go 4-7 x  in a row with Stool  type 7 stool consistency within 2 hours. With out Linzess, Type 1 stool. Daily fluid intake: 60-80 oz of water, 12 fl oz soda, 24 oz coffee, eats lots of green vegetables.  Pt strains 90% of the time.   Fecal seepage occurs overnight and in the morning.    2) R radiating LBP for the past 20 years with herniated discs. No acute injuries. Radiates to knee with walking stairs, repeated movements with walking, eases after one day.  Pain at worst 7/10, best 0/10. Pt has had 7 injections LBP which has helped. Easing factors: rest, NSAIDS, ice, time   3) R testicular pain for the past 5 years. Pt can reproduce the pain with pushing at inner R thigh sometimes. No pain with urination , sometimes pressure at the penile tip with urination and ejaculation. Nocturia: 2 x night on average. Sleep study results showed mild OSA but not warrant CPAP 2021.  Ejaculation and erections are weaker than normal.      PERTINENT HISTORY:    PAIN:  Are you having pain?   PRECAUTIONS: None  WEIGHT BEARING RESTRICTIONS: No  FALLS:  Has patient fallen in last 6 months? No  LIVING ENVIRONMENT: Lives with: lives with their spouse Lives in: House/apartment Stairs: 2 steps from garage   OCCUPATION: sedentary job, Audiological scientist   PLOF: Independent  PATIENT GOALS:     OBJECTIVE:        HOME EXERCISE PROGRAM: See pt instruction section    ASSESSMENT:  CLINICAL IMPRESSION: **   Pt benefits from skilled PT.    OBJECTIVE IMPAIRMENTS decreased activity tolerance, decreased  coordination, decreased endurance, decreased mobility, difficulty walking, decreased ROM, decreased strength, decreased safety awareness, hypomobility, increased muscle spasms, impaired flexibility, improper body mechanics, postural dysfunction, and pain.    ACTIVITY LIMITATIONS  self-care, sleep, home chores, work tasks    PARTICIPATION LIMITATIONS:  community, gym activities    [place]        are also affecting patient's functional outcome.    REHAB POTENTIAL: Good   CLINICAL DECISION MAKING: Evolving/moderate complexity   EVALUATION COMPLEXITY: Moderate    PATIENT EDUCATION:    Education details: Showed pt anatomy images. Explained muscles attachments/ connection, physiology of deep core system/ spinal- thoracic-pelvis-lower kinetic chain as they relate to pt's presentation, Sx, and past Hx. Explained what and how these areas of deficits need to be restored to balance and function    See Therapeutic activity / neuromuscular re-education section  Answered pt's questions.   Person educated: Patient Education method: Explanation, Demonstration, Tactile cues, Verbal cues, and Handouts Education comprehension: verbalized understanding, returned demonstration, verbal cues required, tactile cues required, and needs further education     PLAN: PT FREQUENCY: 1x/week   PT DURATION: 10 weeks   PLANNED INTERVENTIONS: Therapeutic exercises, Therapeutic activity, Neuromuscular re-education, Balance training, Gait training, Patient/Family education, Self Care, Joint mobilization, Spinal mobilization, Moist heat, Taping, and Manual therapy, dry needling.   PLAN FOR NEXT SESSION: See clinical impression for plan     GOALS: Goals reviewed with patient? Yes  SHORT TERM GOALS: Target date: 07/25/2022    Pt will demo IND with HEP                    Baseline: Not IND            Goal status: INITIAL   LONG TERM GOALS: Target date: 09/05/2022     1.Pt will demo proper deep  core coordination without chest breathing and  optimal excursion of diaphragm/pelvic floor in order to promote spinal stability and pelvic floor function  Baseline: dyscoordination Goal status: INITIAL  2.  Pt will demo > 5 pt change on FOTO  to improve QOL and function  PFDI Urinary baseline -  Lower score = better function  Urinary Problem baseline-   Higher score = better function  Pelvic Pain baseline - Lower score = better function  Bowel  constipation baseline -   Higher score = better function  PFDI Bowel - Higher score = better function  Lumber baseline  -  Higher score = better function   Goal status: INITIAL  3.  Pt will demo proper body mechanics in against gravity tasks and ADLs  work tasks, fitness  to minimize straining pelvic floor / back                   Baseline: not IND, improper form that places strain on pelvic floor                Goal status: INITIAL    4.  Pt will report decreased straining with BMs < 50% of the time with taking Linzess in order to decrease  fecal seepage   Baseline: Pt strains 90% of the time. , fecal seepage occurs overnight   Goal status: INITIAL    5. Pt will report decreased R testicular pain with palpation at R groin and less pressure at penile tip with urination and ejaculation across 1 week   Baseline: R testicular pain for the past 5 years. Pt can reproduce the pain with pushing at inner R thigh sometimes. No pain with urination , sometimes pressure at the penile tip with urination and ejaculation. Nocturia: 2 x night on average.  Goal status: INITIAL   6.   Baseline:  Goal status: INITIAL                         7.                         Baseline:Radiates to knee with walking stairs, repeated movements with walking,                       Goal status:                          8. Pt will report increased stool consistency Type 4 > 20% of the time in order to minimize straining                       Baseline:  0% of the time                       Goal Status:    Jerl Mina, PT 06/27/2022, 4:31 PM

## 2022-06-27 NOTE — Patient Instructions (Signed)
  Avoid straining pelvic floor, abdominal muscles , spine  Use log rolling technique instead of getting out of bed with your neck or the sit-up     Log rolling into and out of bed   Log rolling into and out of bed If getting out of bed on R side, Bent knees, scoot hips/ shoulder to L  Raise R arm completely overhead, rolling onto armpit  Then lower bent knees to bed to get into complete side lying position  Then drop legs off bed, and push up onto R elbow/forearm, and use L hand to push onto the bed    Dig elbows and feet to lift hte buttocks and scoot without lifting head    __   Proper body mechanics with getting out of a chair to decrease strain  on back &pelvic floor   Avoid holding your breath when Getting out of the chair:  Scoot to front part of chair chair Heels behind knees, feet are hip width apart, nose over toes  Inhale like you are smelling roses Exhale to stand

## 2022-07-04 ENCOUNTER — Ambulatory Visit: Payer: 59 | Attending: Family Medicine | Admitting: Physical Therapy

## 2022-07-04 DIAGNOSIS — M533 Sacrococcygeal disorders, not elsewhere classified: Secondary | ICD-10-CM | POA: Insufficient documentation

## 2022-07-04 DIAGNOSIS — M5459 Other low back pain: Secondary | ICD-10-CM | POA: Diagnosis present

## 2022-07-04 NOTE — Therapy (Unsigned)
OUTPATIENT PHYSICAL THERAPY TREATMENT    Patient Name: Rick Scott MRN: [id number] DOB:Dec 04, 1980, 42 y.o., male Today's Date: 07/04/2022   PT End of Session - 07/04/22 1637     Visit Number 2    Number of Visits 10    Date for PT Re-Evaluation 09/05/22    Authorization Type 60 max    PT Start Time 1637    PT Stop Time 1720    PT Time Calculation (min) 43 min    Activity Tolerance Patient tolerated treatment well    Behavior During Therapy WFL for tasks assessed/performed             Past Medical History:  Diagnosis Date   Allergy bananas, oranges, melons   Asthma 1996   Well controlled   Chronic diarrhea    has improved recently as diet has improved   Colon polyps    Eczema    GERD (gastroesophageal reflux disease)    Past Surgical History:  Procedure Laterality Date   FINGER SURGERY Right    5th digit   GANGLION CYST EXCISION     Patient Active Problem List   Diagnosis Date Noted   OSA (obstructive sleep apnea) 07/01/2019   Asthma, moderate persistent 04/28/2019   BMI 34.0-34.9,adult 04/28/2019   Abdominal pain with radiation to back 11/12/2017   Testicular pain, right 11/11/2017   Family history of colonic polyps 06/17/2015   Chronic diarrhea 05/18/2015   Low back pain, unspecified 05/18/2015   Joint pain 04/30/2004   Asymptomatic varicose veins of unspecified lower extremity 04/30/1998   Irritable bowel syndrome with constipation 08/29/1994   Gastric reflux 04/30/1994   Asthma due to environmental allergies 04/30/1990   Allergies 08-Sep-1978    PCP: Midge Minium PA  REFERRING PROVIDER: Midge Minium PA  REFERRING DIAG:  K59.09 (ICD-10-CM) - Other constipation  M54.50 (ICD-10-CM) - Low back pain, unspecified  G89.29 (ICD-10-CM) - Other chronic pain   Rationale for Evaluation and Treatment Rehabilitation  THERAPY DIAG:  Other low back pain  Sacrococcygeal disorders, not elsewhere classified  ONSET DATE:   SUBJECTIVE:                      SUBJECTIVE STATEMENT TODAY:  No questions from first visit                                                                                                                                                                          [place]  on EVAL  06/27/22 : 1) constipation / fecal seepage:    Pt  has been an issue since he was 36-63 years old. Pt had anxiety of embarrassment as a childhood with smell, odor sound. Pt  will use public restroom when he has the urge for bowel movement. When he has the urge, nothing happens.  Pt takes Linzess for 7 years . Pt will go 4-7 x  in a row with Stool type 7 stool consistency within 2 hours. With out Linzess, Type 1 stool. Daily fluid intake: 60-80 oz of water, 12 fl oz soda, 24 oz coffee, eats lots of green vegetables.  Pt strains 90% of the time.   Fecal seepage occurs overnight and in the morning.    2) R radiating LBP for the past 20 years with herniated discs. No acute injuries. Radiates to knee with walking stairs, repeated movements with walking, eases after one day.  Pain at worst 7/10, best 0/10. Pt has had 7 injections LBP which has helped. Easing factors: rest, NSAIDS, ice, time   3) R testicular pain for the past 5 years. Pt can reproduce the pain with pushing at inner R thigh sometimes. No pain with urination , sometimes pressure at the penile tip with urination and ejaculation. Nocturia: 2 x night on average. Sleep study results showed mild OSA but not warrant CPAP 2021.  Ejaculation and erections are weaker than normal.      PERTINENT HISTORY:    PAIN:  Are you having pain?   PRECAUTIONS: None  WEIGHT BEARING RESTRICTIONS: No  FALLS:  Has patient fallen in last 6 months? No  LIVING ENVIRONMENT: Lives with: lives with their spouse Lives in: House/apartment Stairs: 2 steps from garage   OCCUPATION: sedentary job, Audiological scientist   PLOF: Independent  PATIENT GOALS:     OBJECTIVE:   [facility] PT  Assessment - 07/05/22 0925       Palpation   SI assessment  R shoulder , L iliac crest higher, L convex thoracic , R lumbar convex.    Palpation comment hypomobile thoracic , L medial/ low scapula, L occiput and cervical attachments             [facility] Adult PT Treatment/Exercise - 07/04/22 1703       Therapeutic Activites    Other Therapeutic Activities discussed ergnomic work station to [employer] upper trap overuse and R shoulder elevated due to height of mouse and keyboard      Neuro Re-ed    Neuro Re-ed Details  cued for neck stretches      Modalities   Modalities Moist Heat      Moist Heat Therapy   Number Minutes Moist Heat 5 Minutes   unbilled   Moist Heat Location --   cervical/ L thoracic ( L posterior rotation )     Manual Therapy   Manual therapy comments STM/MWM at cervical thoracic spine, distraction at occiput               HOME EXERCISE PROGRAM: See pt instruction section    ASSESSMENT:  CLINICAL IMPRESSION:  Manual Tx addressed asymmetries to pelvis and cervical/ thoracic spine.  Pt demo improved alignment of spine post Tx. Discussed ergnomic work station to [employer] upper trap overuse and R shoulder elevated due to height of mouse and keyboard.  Required cues for cervical stretches to be done in bed and also at work. Plan to continue addressing deviations to spine and assess pelvic area next session.    Pt benefits from skilled PT.    OBJECTIVE IMPAIRMENTS decreased activity tolerance, decreased coordination, decreased endurance, decreased mobility, difficulty walking, decreased ROM, decreased strength, decreased safety awareness, hypomobility, increased muscle spasms, impaired flexibility, improper body mechanics,  postural dysfunction, and pain.    ACTIVITY LIMITATIONS  self-care, sleep, home chores, work tasks    PARTICIPATION LIMITATIONS:  community, gym activities    [place]        are also affecting patient's functional  outcome.    REHAB POTENTIAL: Good   CLINICAL DECISION MAKING: Evolving/moderate complexity   EVALUATION COMPLEXITY: Moderate    PATIENT EDUCATION:    Education details: Showed pt anatomy images. Explained muscles attachments/ connection, physiology of deep core system/ spinal- thoracic-pelvis-lower kinetic chain as they relate to pt's presentation, Sx, and past Hx. Explained what and how these areas of deficits need to be restored to balance and function    See Therapeutic activity / neuromuscular re-education section  Answered pt's questions.   Person educated: Patient Education method: Explanation, Demonstration, Tactile cues, Verbal cues, and Handouts Education comprehension: verbalized understanding, returned demonstration, verbal cues required, tactile cues required, and needs further education     PLAN: PT FREQUENCY: 1x/week   PT DURATION: 10 weeks   PLANNED INTERVENTIONS: Therapeutic exercises, Therapeutic activity, Neuromuscular re-education, Balance training, Gait training, Patient/Family education, Self Care, Joint mobilization, Spinal mobilization, Moist heat, Taping, and Manual therapy, dry needling.   PLAN FOR NEXT SESSION: See clinical impression for plan     GOALS: Goals reviewed with patient? Yes  SHORT TERM GOALS: Target date: 07/25/2022    Pt will demo IND with HEP                    Baseline: Not IND            Goal status: INITIAL   LONG TERM GOALS: Target date: 09/05/2022     1.Pt will demo proper deep core coordination without chest breathing and optimal excursion of diaphragm/pelvic floor in order to promote spinal stability and pelvic floor function  Baseline: dyscoordination Goal status: INITIAL  2.  Pt will demo > 5 pt change on FOTO  to improve QOL and function   Pelvic Pain baseline -25 Lower score = better function  Bowel  constipation baseline -49   Higher score = better function  PFDI Bowel -21 Higher score = better  function  Lumber baseline  -  58 pts  Higher score = better function   Goal status: INITIAL  3.  Pt will demo proper body mechanics in against gravity tasks and ADLs  work tasks, fitness  to minimize straining pelvic floor / back                   Baseline: not IND, improper form that places strain on pelvic floor                Goal status: INITIAL    4.  Pt will report decreased straining with BMs < 50% of the time with taking Linzess in order to decrease  fecal seepage   Baseline: Pt strains 90% of the time. , fecal seepage occurs overnight   Goal status: INITIAL    5. Pt will report decreased R testicular pain with palpation at R groin and less pressure at penile tip with urination and ejaculation across 1 week   Baseline: R testicular pain for the past 5 years. Pt can reproduce the pain with pushing at inner R thigh sometimes. No pain with urination , sometimes pressure at the penile tip with urination and ejaculation. Nocturia: 2 x night on average.  Goal status: INITIAL   6. Pt will demo levelled pelvic girdle and shoulder  height in order to progress to deep core strengthening HEP and restore mobility at spine, pelvis, gait, posture    Baseline: R shoulder , L iliac crest higher, L convex thoracic , R lumbar convex.  Supine: L iliac crest higher/ malleoli higher  Goal status: INITIAL                         7.                         Baseline:Radiates to knee with walking stairs, repeated movements with walking,                       Goal status: INITAL                         8. Pt will report increased stool consistency Type 4 > 20% of the time in order to minimize straining                       Baseline: 0% of the time                       Goal Status: INITAL   Jerl Mina, PT 07/04/2022, 4:38 PM

## 2022-07-04 NOTE — Patient Instructions (Signed)
   Neck / shoulder stretches:    Lying on back - small sushi roll towel under neck  _ 6 directions  5 reps     At work:     Automotive engineer with head turns :  stand perpendicular to the wall, L side to the wall Tilt head to wall  Place L palm at 7 o clock Chin tuck like you are looking into armpit Look at "[place] on giant map  Swivel head with chin tucked to look in upper corner of ceiling as if you are look at  [place] on giant map "   5 reps   Switch direction, R palm on wall at 5 o 'clock   Chin tuck like you are looking into armpit Look at "[place]  on giant map  Swivel head with chin tucked to look in upper corner of ceiling as if you are look at  [facility] on giant map "   5 reps    __   Lengthen Back rib by L  shoulder ( winging)    Lie on R  side , pillow between knees and under head  Pull  L arm overhead over mattress, grab the edge of mattress,pull it upward, drawing elbow away from ears  Breathing 10 reps  Brushing arm with 3/4 turn onto pillow behind back  Lying on R  side ,Pillow/ Block between knees     dragging top forearm across ribs below breast rotating 3/4 turn,  rotating  _L_ only this week ,  relax onto the pillow behind the back  and then back to other palm , maintain top palm on body whole top and not lift shoulder  ___

## 2022-07-11 ENCOUNTER — Ambulatory Visit: Payer: 59 | Admitting: Physical Therapy

## 2022-07-11 DIAGNOSIS — M5459 Other low back pain: Secondary | ICD-10-CM

## 2022-07-11 DIAGNOSIS — M533 Sacrococcygeal disorders, not elsewhere classified: Secondary | ICD-10-CM

## 2022-07-11 NOTE — Therapy (Signed)
OUTPATIENT PHYSICAL THERAPY TREATMENT    Patient Name: Rick Scott MRN: [id number] DOB:July 25, 1980, 42 y.o., male Today's Date: 07/11/2022   PT End of Session - 07/11/22 1651     Visit Number 3    Number of Visits 10    Date for PT Re-Evaluation 09/05/22    Authorization Type 60 max    PT Start Time [id number]    PT Stop Time [id number]    PT Time Calculation (min) 40 min    Activity Tolerance Patient tolerated treatment well    Behavior During Therapy WFL for tasks assessed/performed             Past Medical History:  Diagnosis Date   Allergy bananas, oranges, melons   Asthma 1996   Well controlled   Chronic diarrhea    has improved recently as diet has improved   Colon polyps    Eczema    GERD (gastroesophageal reflux disease)    Past Surgical History:  Procedure Laterality Date   FINGER SURGERY Right    5th digit   GANGLION CYST EXCISION     Patient Active Problem List   Diagnosis Date Noted   OSA (obstructive sleep apnea) 07/01/2019   Asthma, moderate persistent 04/28/2019   BMI 34.0-34.9,adult 04/28/2019   Abdominal pain with radiation to back 11/12/2017   Testicular pain, right 11/11/2017   Family history of colonic polyps 06/17/2015   Chronic diarrhea 05/18/2015   Low back pain, unspecified 05/18/2015   Joint pain 04/30/2004   Asymptomatic varicose veins of unspecified lower extremity 04/30/1998   Irritable bowel syndrome with constipation 08/29/1994   Gastric reflux 04/30/1994   Asthma due to environmental allergies 04/30/1990   Allergies 1979/03/31    PCP: Midge Minium PA  REFERRING PROVIDER: Midge Minium PA  REFERRING DIAG:  K59.09 (ICD-10-CM) - Other constipation  M54.50 (ICD-10-CM) - Low back pain, unspecified  G89.29 (ICD-10-CM) - Other chronic pain   Rationale for Evaluation and Treatment Rehabilitation  THERAPY DIAG:  Other low back pain  Sacrococcygeal disorders, not elsewhere classified  ONSET DATE:    SUBJECTIVE:                     SUBJECTIVE STATEMENT TODAY:  Pt has question about car seat. Thinking about getting al Arlyn Leak support pillow. Made adjustments at work station.    Pt leans to the L all the time. Sitting on his L sitting bone.   Pt has less radiating LBP by 20-30% in  general in the past 3 weeks.  SUBJECTIVE STATEMENT  on EVAL  06/27/22 : 1) constipation / fecal seepage:    Pt  has been an issue since he was 34-6 years old. Pt had anxiety of embarrassment as a childhood with smell, odor sound. Pt will use public restroom when he has the urge for bowel movement. When he has the urge, nothing happens.  Pt takes Linzess for 7 years . Pt will go 4-7 x  in a row with Stool type 7 stool consistency within 2 hours. With out Linzess, Type 1 stool. Daily fluid intake: 60-80 oz of water, 12 fl oz soda, 24 oz coffee, eats lots of green vegetables.  Pt strains 90% of the time.   Fecal seepage occurs overnight and in the morning.    2) R radiating LBP for the past 20 years with herniated discs. No acute injuries. Radiates to knee with walking stairs, repeated movements with walking, eases after one day.  Pain at worst 7/10, best 0/10. Pt has had 7 injections LBP which has helped. Easing factors: rest, NSAIDS, ice, time   3) R testicular pain for the past 5 years. Pt can reproduce the pain with pushing at inner R thigh sometimes. No pain with urination , sometimes pressure at the penile tip with urination and ejaculation. Nocturia: 2 x night on average. Sleep study results showed mild OSA but not warrant CPAP 2021.  Ejaculation and erections are weaker than normal.      PERTINENT HISTORY:    PAIN:  Are you having pain?   PRECAUTIONS: None  WEIGHT BEARING RESTRICTIONS: No  FALLS:  Has patient fallen in last 6  months? No  LIVING ENVIRONMENT: Lives with: lives with their spouse Lives in: House/apartment Stairs: 2 steps from garage   OCCUPATION: sedentary job, Audiological scientist   PLOF: Independent  PATIENT GOALS:     OBJECTIVE:     [facility] PT Assessment - 07/11/22 1652       Posture/Postural Control   Posture Comments less forward head posture / thoracic kyphosis      Palpation   SI assessment  R shoulder , L iliac crest higher, L convex thoracic , R lumbar convex.      Ambulation/Gait   Gait Comments 1.46 m/s equal WBing BLE             [facility] Adult PT Treatment/Exercise - 07/11/22 1652       Therapeutic Activites    Other Therapeutic Activities discussed awareness to not lean on L ischial tuberosity to see if scoliosis will be corrected and if not, assess if shoe lift is appropriate.    Discussed car seat modification  explained anatomy/ physiology of deep core and coordination on helping his Sx and impact of posture on pelvic floor      Neuro Re-ed    Neuro Re-ed Details  cued for deep core level 1-2 with proper sequence               HOME EXERCISE PROGRAM: See pt instruction section    ASSESSMENT:  CLINICAL IMPRESSION:   Pt has less radiating LBP by 20-30% in  general in the past 3 weeks. Pt still presents with L iliac crest , R shoulder higher than opposite. Pt has a tendency to lean on L sitting bones when sitting.    Pt's gait pattern has improved in speed.   Progressed to deep core HEP with minor cues.  Plan to review deep core and add multidifis strengthening.   Discussed car seat  modification  Explained anatomy/ physiology of deep core and coordination on helping his Sx and impact of posture on pelvic floor   Pt benefits from skilled PT.    OBJECTIVE IMPAIRMENTS decreased activity tolerance, decreased coordination, decreased endurance, decreased mobility, difficulty walking, decreased ROM, decreased strength, decreased safety awareness,  hypomobility, increased muscle spasms, impaired flexibility, improper body mechanics, postural dysfunction, and pain.    ACTIVITY LIMITATIONS  self-care, sleep, home chores, work tasks    PARTICIPATION LIMITATIONS:  community, gym activities    [place]        are also affecting patient's functional outcome.    REHAB POTENTIAL: Good   CLINICAL DECISION MAKING: Evolving/moderate complexity   EVALUATION COMPLEXITY: Moderate    PATIENT EDUCATION:    Education details: Showed pt anatomy images. Explained muscles attachments/ connection, physiology of deep core system/ spinal- thoracic-pelvis-lower kinetic chain as they relate to pt's presentation, Sx, and past Hx. Explained what and how these areas of deficits need to be restored to balance and function    See Therapeutic activity / neuromuscular re-education section  Answered pt's questions.   Person educated: Patient Education method: Explanation, Demonstration, Tactile cues, Verbal cues, and Handouts Education comprehension: verbalized understanding, returned demonstration, verbal cues required, tactile cues required, and needs further education     PLAN: PT FREQUENCY: 1x/week   PT DURATION: 10 weeks   PLANNED INTERVENTIONS: Therapeutic exercises, Therapeutic activity, Neuromuscular re-education, Balance training, Gait training, Patient/Family education, Self Care, Joint mobilization, Spinal mobilization, Moist heat, Taping, and Manual therapy, dry needling.   PLAN FOR NEXT SESSION: See clinical impression for plan     GOALS: Goals reviewed with patient? Yes  SHORT TERM GOALS: Target date: 07/25/2022    Pt will demo IND with HEP                    Baseline: Not IND            Goal status: INITIAL   LONG TERM GOALS: Target date: 09/05/2022     1.Pt will demo proper deep core coordination without chest breathing and optimal excursion of diaphragm/pelvic floor in order to promote spinal stability and pelvic  floor function  Baseline: dyscoordination Goal status: INITIAL  2.  Pt will demo > 5 pt change on FOTO  to improve QOL and function   Pelvic Pain baseline -25 Lower score = better function  Bowel  constipation baseline -49   Higher score = better function  PFDI Bowel -21 Higher score = better function  Lumber baseline  -  58 pts  Higher score = better function   Goal status: INITIAL  3.  Pt will demo proper body mechanics in against gravity tasks and ADLs  work tasks, fitness  to minimize straining pelvic floor / back                   Baseline: not IND, improper form that places strain on pelvic floor                Goal status: INITIAL    4.  Pt will report decreased straining with BMs < 50% of the time with taking Linzess in order to decrease  fecal seepage   Baseline: Pt strains 90% of the time. , fecal seepage occurs overnight   Goal status: INITIAL    5. Pt will report decreased R testicular pain with palpation at R groin and less pressure at penile tip with urination and ejaculation across 1  week   Baseline: R testicular pain for the past 5 years. Pt can reproduce the pain with pushing at inner R thigh sometimes. No pain with urination , sometimes pressure at the penile tip with urination and ejaculation. Nocturia: 2 x night on average.  Goal status: INITIAL   6. Pt will demo levelled pelvic girdle and shoulder height in order to progress to deep core strengthening HEP and restore mobility at spine, pelvis, gait, posture    Baseline: R shoulder , L iliac crest higher, L convex thoracic , R lumbar convex.  Supine: L iliac crest higher/ malleoli higher  Goal status: INITIAL                         7.                         Baseline:Radiates to knee with walking stairs, repeated movements with walking,                       Goal status: INITAL                         8. Pt will report increased stool consistency Type 4 > 20% of the time in order to minimize  straining                       Baseline: 0% of the time                       Goal Status: INITAL   Jerl Mina, PT 07/11/2022, 4:51 PM

## 2022-07-11 NOTE — Patient Instructions (Signed)
No leaning to L when sitting   Deep core level 1-2

## 2022-07-18 ENCOUNTER — Ambulatory Visit: Payer: 59 | Admitting: Physical Therapy

## 2022-07-18 DIAGNOSIS — M5459 Other low back pain: Secondary | ICD-10-CM | POA: Diagnosis not present

## 2022-07-18 DIAGNOSIS — M533 Sacrococcygeal disorders, not elsewhere classified: Secondary | ICD-10-CM

## 2022-07-18 NOTE — Patient Instructions (Addendum)
   Stretch for pelvic floor      Childs pose rocking   Toes tucked, shoulders down and back, on forearms , hands shoulder width apart, fingers straight, elbow back , squeeze imaginary pencils in armpit, shoulder down and away from ears  10 reps   __  On belly: Riding horse edge of mattress  knee bent like riding a horse, move knee towards armpit and out  10 reps  __      Frog stretch: laying on belly with pillow under hips, knees bent, inhale do nothing, exhale let ankles fall apart   30 reps     __  Applying Pelvic tilts:  Finding a comfortable position when laying on your back  Laying on your back, lift hips up, then scoot tail under, lowering ribs / midback first, then the low back Pillow under knees    Decreasing Low back pain:  Pelvic tilts Forward, Back, and Neutral   STANDING ( neutral is where you want to practice finding more and more often. More weight across the ball mound of feet and heels not only the heels, and not locking the knees.   SITTING: feet under knees, hip width apart. Weigh on the sitting bones. Thumb on the back iliac crest, Index finger at the front of the hip. Rock through 3 positions to find neutral, press in the feet and sense the sitting bones ( ischial tuberosity) in contact to the seat.   Posterior tilt ( thumb is lower)  Anterior tilt ( index finger is lower).   Neutral ( thumb and index finger is levelled)    Sitting at work chair that may have a dip in the seat Place folded towel/ blanket placed towards the back of the seat , sitting on sitting bones , don't lean to the back of the chair    WHILE ON YOUR BACK, KNEES BENT, FEET HIP WIDTH APART  Pelvic tilts, to avoid overtightening gluts and adductors and obliques   Not rock back so far, not tightening adductors More ballmounds contact  Thumb at ribs, index finger at ASIS to make sure to not over do  Exhale on backward rock Inhale tailbone into table

## 2022-07-18 NOTE — Therapy (Unsigned)
OUTPATIENT PHYSICAL THERAPY TREATMENT    Patient Name: Rick Scott MRN: [id number] DOB:June 26, 1980, 42 y.o., male Today's Date: 07/18/2022   PT End of Session - 07/18/22 1708     Visit Number 4    Number of Visits 10    Date for PT Re-Evaluation 09/05/22    Authorization Type 60 max    PT Start Time 1636    PT Stop Time [id number]    PT Time Calculation (min) 39 min    Activity Tolerance Patient tolerated treatment well    Behavior During Therapy WFL for tasks assessed/performed             Past Medical History:  Diagnosis Date   Allergy bananas, oranges, melons   Asthma 1996   Well controlled   Chronic diarrhea    has improved recently as diet has improved   Colon polyps    Eczema    GERD (gastroesophageal reflux disease)    Past Surgical History:  Procedure Laterality Date   FINGER SURGERY Right    5th digit   GANGLION CYST EXCISION     Patient Active Problem List   Diagnosis Date Noted   OSA (obstructive sleep apnea) 07/01/2019   Asthma, moderate persistent 04/28/2019   BMI 34.0-34.9,adult 04/28/2019   Abdominal pain with radiation to back 11/12/2017   Testicular pain, right 11/11/2017   Family history of colonic polyps 06/17/2015   Chronic diarrhea 05/18/2015   Low back pain, unspecified 05/18/2015   Joint pain 04/30/2004   Asymptomatic varicose veins of unspecified lower extremity 04/30/1998   Irritable bowel syndrome with constipation 08/29/1994   Gastric reflux 04/30/1994   Asthma due to environmental allergies 04/30/1990   Allergies Jun 04, 1980    PCP: Midge Minium PA  REFERRING PROVIDER: Midge Minium PA  REFERRING DIAG:  K59.09 (ICD-10-CM) - Other constipation  M54.50 (ICD-10-CM) - Low back pain, unspecified  G89.29 (ICD-10-CM) - Other chronic pain   Rationale for Evaluation and Treatment Rehabilitation  THERAPY DIAG:  Other low back pain  Sacrococcygeal disorders, not elsewhere classified  ONSET DATE:    SUBJECTIVE:                     SUBJECTIVE STATEMENT TODAY:  Pt noticed he does lean on his L side when sitting. Pt is trying to correct this pattern.                                                                                                                                                                          SUBJECTIVE STATEMENT  on EVAL  06/27/22 : 1) constipation / fecal seepage:    Pt  has been an issue since he was 69-18 years old.  Pt had anxiety of embarrassment as a childhood with smell, odor sound. Pt will use public restroom when he has the urge for bowel movement. When he has the urge, nothing happens.  Pt takes Linzess for 7 years . Pt will go 4-7 x  in a row with Stool type 7 stool consistency within 2 hours. With out Linzess, Type 1 stool. Daily fluid intake: 60-80 oz of water, 12 fl oz soda, 24 oz coffee, eats lots of green vegetables.  Pt strains 90% of the time.   Fecal seepage occurs overnight and in the morning.    2) R radiating LBP for the past 20 years with herniated discs. No acute injuries. Radiates to knee with walking stairs, repeated movements with walking, eases after one day.  Pain at worst 7/10, best 0/10. Pt has had 7 injections LBP which has helped. Easing factors: rest, NSAIDS, ice, time   3) R testicular pain for the past 5 years. Pt can reproduce the pain with pushing at inner R thigh sometimes. No pain with urination , sometimes pressure at the penile tip with urination and ejaculation. Nocturia: 2 x night on average. Sleep study results showed mild OSA but not warrant CPAP 2021.  Ejaculation and erections are weaker than normal.      PERTINENT HISTORY:    PAIN:  Are you having pain?   PRECAUTIONS: None  WEIGHT BEARING RESTRICTIONS: No  FALLS:  Has patient fallen in last 6 months? No  LIVING ENVIRONMENT: Lives with: lives with their spouse Lives in: House/apartment Stairs: 2 steps from garage   OCCUPATION: sedentary job, Journalist, newspaper   PLOF: Independent  PATIENT GOALS:     OBJECTIVE:     [facility] PT Assessment - 07/18/22 1709       Observation/Other Assessments   Observations upright sitting      Coordination   Coordination and Movement Description googd carry over with deep core coordination'      Posture/Postural Control   Posture Comments no more forward head nor asymmetries to the spine             Pelvic Floor Special Questions - 07/18/22 1708     External Perineal Exam through clothing, hooklying:  R pelvic floor posterior limited mobility, tender to palpation,    External Palpation asymmetrical contraciton with cue for kegel , R limited in upward movement              [facility] Adult PT Treatment/Exercise - 07/18/22 1709       Therapeutic Activites    Other Therapeutic Activities discussed continue with posture awareness to not lean on L side  which is controibuting to tighter R pelvic floor mm, provided restorative yoga support childs pose to promote anterior tilt of pelvis      Neuro Re-ed    Neuro Re-ed Details  Cued for childs pose rocking, and posterior pelvic floor stretches, anterior pelvic tilt      Manual Therapy   Manual therapy comments PA mob Grade II III at R SIJ to promote mobility, STM/MWM ato promote R posterior pelvic floor mobility   through clothing                   HOME EXERCISE PROGRAM: See pt instruction section    ASSESSMENT:  CLINICAL IMPRESSION:  External pelvic floor assessment through clothing showed tighter R pelvic floor . Suspect this is related to pt's tendency to lean on L hip in seated position. Pt voiced he  is noticing and changing this pattern. External manual Tx through clothing was performed to release these tensions on R and promote SIJ mobility to promote nutation. Cued for pelvic floor stretches and lengthening of pelvic floor.  Cued for anterior tilt of pelvic floor in hooklying and standing posture to optimize pelvic floor  function.  Plan to reassess pelvic floor and mmm attachment of global mm of LKC. Plan to add multidifisstrengthening at next session.  Pt benefits from skilled PT.    OBJECTIVE IMPAIRMENTS decreased activity tolerance, decreased coordination, decreased endurance, decreased mobility, difficulty walking, decreased ROM, decreased strength, decreased safety awareness, hypomobility, increased muscle spasms, impaired flexibility, improper body mechanics, postural dysfunction, and pain.    ACTIVITY LIMITATIONS  self-care, sleep, home chores, work tasks    PARTICIPATION LIMITATIONS:  community, gym activities    [place]        are also affecting patient's functional outcome.    REHAB POTENTIAL: Good   CLINICAL DECISION MAKING: Evolving/moderate complexity   EVALUATION COMPLEXITY: Moderate    PATIENT EDUCATION:    Education details: Showed pt anatomy images. Explained muscles attachments/ connection, physiology of deep core system/ spinal- thoracic-pelvis-lower kinetic chain as they relate to pt's presentation, Sx, and past Hx. Explained what and how these areas of deficits need to be restored to balance and function    See Therapeutic activity / neuromuscular re-education section  Answered pt's questions.   Person educated: Patient Education method: Explanation, Demonstration, Tactile cues, Verbal cues, and Handouts Education comprehension: verbalized understanding, returned demonstration, verbal cues required, tactile cues required, and needs further education     PLAN: PT FREQUENCY: 1x/week   PT DURATION: 10 weeks   PLANNED INTERVENTIONS: Therapeutic exercises, Therapeutic activity, Neuromuscular re-education, Balance training, Gait training, Patient/Family education, Self Care, Joint mobilization, Spinal mobilization, Moist heat, Taping, and Manual therapy, dry needling.   PLAN FOR NEXT SESSION: See clinical impression for plan     GOALS: Goals reviewed with patient?  Yes  SHORT TERM GOALS: Target date: 07/25/2022    Pt will demo IND with HEP                    Baseline: Not IND            Goal status: INITIAL   LONG TERM GOALS: Target date: 09/05/2022     1.Pt will demo proper deep core coordination without chest breathing and optimal excursion of diaphragm/pelvic floor in order to promote spinal stability and pelvic floor function  Baseline: dyscoordination Goal status: INITIAL  2.  Pt will demo > 5 pt change on FOTO  to improve QOL and function   Pelvic Pain baseline -25 Lower score = better function  Bowel  constipation baseline -49   Higher score = better function  PFDI Bowel -21 Higher score = better function  Lumber baseline  -  58 pts  Higher score = better function   Goal status: INITIAL  3.  Pt will demo proper body mechanics in against gravity tasks and ADLs  work tasks, fitness  to minimize straining pelvic floor / back                   Baseline: not IND, improper form that places strain on pelvic floor                Goal status: INITIAL    4.  Pt will report decreased straining with BMs < 50% of the time with taking Linzess in  order to decrease  fecal seepage   Baseline: Pt strains 90% of the time. , fecal seepage occurs overnight   Goal status: INITIAL    5. Pt will report decreased R testicular pain with palpation at R groin and less pressure at penile tip with urination and ejaculation across 1 week   Baseline: R testicular pain for the past 5 years. Pt can reproduce the pain with pushing at inner R thigh sometimes. No pain with urination , sometimes pressure at the penile tip with urination and ejaculation. Nocturia: 2 x night on average.  Goal status: INITIAL   6. Pt will demo levelled pelvic girdle and shoulder height in order to progress to deep core strengthening HEP and restore mobility at spine, pelvis, gait, posture    Baseline: R shoulder , L iliac crest higher, L convex thoracic , R lumbar  convex.  Supine: L iliac crest higher/ malleoli higher  Goal status: INITIAL                         7.                         Baseline:Radiates to knee with walking stairs, repeated movements with walking,                       Goal status: INITAL                         8. Pt will report increased stool consistency Type 4 > 20% of the time in order to minimize straining                       Baseline: 0% of the time                       Goal Status: INITAL   Jerl Mina, PT 07/18/2022, 5:18 PM

## 2022-07-25 ENCOUNTER — Ambulatory Visit: Payer: 59 | Admitting: Physical Therapy

## 2022-07-25 DIAGNOSIS — M533 Sacrococcygeal disorders, not elsewhere classified: Secondary | ICD-10-CM

## 2022-07-25 DIAGNOSIS — M5459 Other low back pain: Secondary | ICD-10-CM

## 2022-07-25 NOTE — Therapy (Unsigned)
OUTPATIENT PHYSICAL THERAPY TREATMENT    Patient Name: Rick Scott MRN: [id number] DOB:1980/09/15, 42 y.o., male Today's Date: 07/25/2022   PT End of Session - 07/25/22 1636     Visit Number 5    Number of Visits 10    Date for PT Re-Evaluation 09/05/22    Authorization Type 60 max    PT Start Time 1636    PT Stop Time [id number]    PT Time Calculation (min) 39 min    Activity Tolerance Patient tolerated treatment well    Behavior During Therapy WFL for tasks assessed/performed             Past Medical History:  Diagnosis Date   Allergy bananas, oranges, melons   Asthma 1996   Well controlled   Chronic diarrhea    has improved recently as diet has improved   Colon polyps    Eczema    GERD (gastroesophageal reflux disease)    Past Surgical History:  Procedure Laterality Date   FINGER SURGERY Right    5th digit   GANGLION CYST EXCISION     Patient Active Problem List   Diagnosis Date Noted   OSA (obstructive sleep apnea) 07/01/2019   Asthma, moderate persistent 04/28/2019   BMI 34.0-34.9,adult 04/28/2019   Abdominal pain with radiation to back 11/12/2017   Testicular pain, right 11/11/2017   Family history of colonic polyps 06/17/2015   Chronic diarrhea 05/18/2015   Low back pain, unspecified 05/18/2015   Joint pain 04/30/2004   Asymptomatic varicose veins of unspecified lower extremity 04/30/1998   Irritable bowel syndrome with constipation 08/29/1994   Gastric reflux 04/30/1994   Asthma due to environmental allergies 04/30/1990   Allergies 01-05-79    PCP: Midge Minium PA  REFERRING PROVIDER: Midge Minium PA  REFERRING DIAG:  K59.09 (ICD-10-CM) - Other constipation  M54.50 (ICD-10-CM) - Low back pain, unspecified  G89.29 (ICD-10-CM) - Other chronic pain   Rationale for Evaluation and Treatment Rehabilitation  THERAPY DIAG:  Other low back pain  Sacrococcygeal disorders, not elsewhere classified  ONSET DATE:    SUBJECTIVE:                     SUBJECTIVE STATEMENT TODAY:  Pt has been working on  keeping his R shoulder down at work. Pt is still sleeping on his R arm and it gets pain at the outer part of the arm into the finger.   Pt has been urinating less at night from 2-3 x to 1 x night.  SUBJECTIVE STATEMENT  on EVAL  06/27/22 : 1) constipation / fecal seepage:    Pt  has been an issue since he was [age] years old. Pt had anxiety of embarrassment as a childhood with smell, odor sound. Pt will use public restroom when he has the urge for bowel movement. When he has the urge, nothing happens.  Pt takes Linzess for 7 years . Pt will go 4-7 x  in a row with Stool type 7 stool consistency within 2 hours. With out Linzess, Type 1 stool. Daily fluid intake: 60-80 oz of water, 12 fl oz soda, 24 oz coffee, eats lots of green vegetables.  Pt strains 90% of the time.   Fecal seepage occurs overnight and in the morning.    2) R radiating LBP for the past 20 years with herniated discs. No acute injuries. Radiates to knee with walking stairs, repeated movements with walking, eases after one day.  Pain at worst 7/10, best 0/10. Pt has had 7 injections LBP which has helped. Easing factors: rest, NSAIDS, ice, time   3) R testicular pain for the past 5 years. Pt can reproduce the pain with pushing at inner R thigh sometimes. No pain with urination , sometimes pressure at the penile tip with urination and ejaculation. Nocturia: 2 x night on average. Sleep study results showed mild OSA but not warrant CPAP 2021.  Ejaculation and erections are weaker than normal.      PERTINENT HISTORY:    PAIN:  Are you having pain?   PRECAUTIONS: None  WEIGHT BEARING RESTRICTIONS: No  FALLS:  Has patient fallen in last 6 months? No  LIVING  ENVIRONMENT: Lives with: lives with their spouse Lives in: House/apartment Stairs: 2 steps from garage   OCCUPATION: sedentary job, Audiological scientist   PLOF: Independent  PATIENT GOALS:     OBJECTIVE:                   HOME EXERCISE PROGRAM: See pt instruction section    ASSESSMENT:  CLINICAL IMPRESSION:    Improvement:  Pt has been urinating less at night from 2-3 x to 1 x night.    Plan to add multidifisstrengthening at next session.  Pt benefits from skilled PT.    OBJECTIVE IMPAIRMENTS decreased activity tolerance, decreased coordination, decreased endurance, decreased mobility, difficulty walking, decreased ROM, decreased strength, decreased safety awareness, hypomobility, increased muscle spasms, impaired flexibility, improper body mechanics, postural dysfunction, and pain.    ACTIVITY LIMITATIONS  self-care, sleep, home chores, work tasks    PARTICIPATION LIMITATIONS:  community, gym activities    Calcium        are also affecting patient's functional outcome.    REHAB POTENTIAL: Good   CLINICAL DECISION MAKING: Evolving/moderate complexity   EVALUATION COMPLEXITY: Moderate    PATIENT EDUCATION:    Education details: Showed pt anatomy images. Explained muscles attachments/ connection, physiology of deep core system/ spinal- thoracic-pelvis-lower kinetic chain as they relate to pt's presentation, Sx, and past Hx. Explained what and how these areas of deficits need to be restored to balance and function    See Therapeutic activity / neuromuscular re-education section  Answered pt's questions.   Person educated: Patient Education method: Explanation, Demonstration, Tactile cues, Verbal cues, and Handouts Education comprehension: verbalized understanding, returned demonstration, verbal cues required, tactile cues required, and needs further education     PLAN: PT FREQUENCY: 1x/week   PT DURATION: 10 weeks   PLANNED  INTERVENTIONS: Therapeutic exercises, Therapeutic activity, Neuromuscular  re-education, Balance training, Gait training, Patient/Family education, Self Care, Joint mobilization, Spinal mobilization, Moist heat, Taping, and Manual therapy, dry needling.   PLAN FOR NEXT SESSION: See clinical impression for plan     GOALS: Goals reviewed with patient? Yes  SHORT TERM GOALS: Target date: 07/25/2022    Pt will demo IND with HEP                    Baseline: Not IND            Goal status: INITIAL   LONG TERM GOALS: Target date: 09/05/2022     1.Pt will demo proper deep core coordination without chest breathing and optimal excursion of diaphragm/pelvic floor in order to promote spinal stability and pelvic floor function  Baseline: dyscoordination Goal status: INITIAL  2.  Pt will demo > 5 pt change on FOTO  to improve QOL and function   Pelvic Pain baseline -25 Lower score = better function  Bowel  constipation baseline -49   Higher score = better function  PFDI Bowel -21 Higher score = better function  Lumber baseline  -  58 pts  Higher score = better function   Goal status: INITIAL  3.  Pt will demo proper body mechanics in against gravity tasks and ADLs  work tasks, fitness  to minimize straining pelvic floor / back                   Baseline: not IND, improper form that places strain on pelvic floor                Goal status: INITIAL    4.  Pt will report decreased straining with BMs < 50% of the time with taking Linzess in order to decrease  fecal seepage   Baseline: Pt strains 90% of the time. , fecal seepage occurs overnight   Goal status: INITIAL    5. Pt will report decreased R testicular pain with palpation at R groin and less pressure at penile tip with urination and ejaculation across 1 week   Baseline: R testicular pain for the past 5 years. Pt can reproduce the pain with pushing at inner R thigh sometimes. No pain with urination , sometimes pressure  at the penile tip with urination and ejaculation. Nocturia: 2 x night on average.  Goal status: INITIAL   6. Pt will demo levelled pelvic girdle and shoulder height in order to progress to deep core strengthening HEP and restore mobility at spine, pelvis, gait, posture    Baseline: R shoulder , L iliac crest higher, L convex thoracic , R lumbar convex.  Supine: L iliac crest higher/ malleoli higher  Goal status: MET                          7.                         Baseline:Radiates to knee with walking stairs, repeated movements with walking,                       Goal status: INITAL                         8. Pt will report increased stool consistency Type 4 > 20% of the time in order to minimize straining  Baseline: 0% of the time                       Goal Status: INITAL   Jerl Mina, PT 07/25/2022, 4:37 PM

## 2022-07-25 NOTE — Patient Instructions (Signed)
  Locust pose  Pillow under hips if needed for decreased low back pain  Palms face midline by hips  Finger tips shooting straight down  Imagine holding pencil under your armpits Draw shoulders away from ears Inhale Exhale lift chest up slightly without feeling it in your back. The bend happens in the midback  (keep chin tucked)   15 reps   _____________________________________ Angle wings after neck exercise Dragging arms  10 reps

## 2022-08-01 ENCOUNTER — Ambulatory Visit: Payer: 59 | Attending: Family Medicine | Admitting: Physical Therapy

## 2022-08-01 DIAGNOSIS — M5459 Other low back pain: Secondary | ICD-10-CM | POA: Diagnosis present

## 2022-08-01 DIAGNOSIS — M533 Sacrococcygeal disorders, not elsewhere classified: Secondary | ICD-10-CM | POA: Diagnosis present

## 2022-08-01 NOTE — Therapy (Unsigned)
OUTPATIENT PHYSICAL THERAPY TREATMENT    Patient Name: Rick Scott MRN: [id number] DOB:02/04/1981, 42 y.o., male Today's Date: 08/01/2022   PT End of Session - 08/01/22 1635     Visit Number 6    Number of Visits 10    Date for PT Re-Evaluation 09/05/22    Authorization Type 60 max    PT Start Time 34    PT Stop Time [id number]    PT Time Calculation (min) 45 min    Activity Tolerance Patient tolerated treatment well    Behavior During Therapy WFL for tasks assessed/performed             Past Medical History:  Diagnosis Date   Allergy bananas, oranges, melons   Asthma 1996   Well controlled   Chronic diarrhea    has improved recently as diet has improved   Colon polyps    Eczema    GERD (gastroesophageal reflux disease)    Past Surgical History:  Procedure Laterality Date   FINGER SURGERY Right    5th digit   GANGLION CYST EXCISION     Patient Active Problem List   Diagnosis Date Noted   OSA (obstructive sleep apnea) 07/01/2019   Asthma, moderate persistent 04/28/2019   BMI 34.0-34.9,adult 04/28/2019   Abdominal pain with radiation to back 11/12/2017   Testicular pain, right 11/11/2017   Family history of colonic polyps 06/17/2015   Chronic diarrhea 05/18/2015   Low back pain, unspecified 05/18/2015   Joint pain 04/30/2004   Asymptomatic varicose veins of unspecified lower extremity 04/30/1998   Irritable bowel syndrome with constipation 08/29/1994   Gastric reflux 04/30/1994   Asthma due to environmental allergies 04/30/1990   Allergies 04/25/1979    PCP: Midge Minium PA  REFERRING PROVIDER: Midge Minium PA  REFERRING DIAG:  K59.09 (ICD-10-CM) - Other constipation  M54.50 (ICD-10-CM) - Low back pain, unspecified  G89.29 (ICD-10-CM) - Other chronic pain   Rationale for Evaluation and Treatment Rehabilitation  THERAPY DIAG:  Other low back pain  Sacrococcygeal disorders, not elsewhere classified  ONSET DATE:   SUBJECTIVE:                      SUBJECTIVE STATEMENT TODAY:  Pt  's LBP has improved by > 50% and no longer radiates.    Constipation is still a struggle. It is slightly better in the mornings.  Straining 50% of the time instead of 90%.    Nocturia decreased from 2x night to 0-1 x  night.   No change with testicular pain.  SUBJECTIVE STATEMENT  on EVAL  06/27/22 : 1) constipation / fecal seepage:    Pt  has been an issue since he was 66-84 years old. Pt had anxiety of embarrassment as a childhood with smell, odor sound. Pt will use public restroom when he has the urge for bowel movement. When he has the urge, nothing happens.  Pt takes Linzess for 7 years . Pt will go 4-7 x  in a row with Stool type 7 stool consistency within 2 hours. With out Linzess, Type 1 stool. Daily fluid intake: 60-80 oz of water, 12 fl oz soda, 24 oz coffee, eats lots of green vegetables.  Pt strains 90% of the time.   Fecal seepage occurs overnight and in the morning.    2) R radiating LBP for the past 20 years with herniated discs. No acute injuries. Radiates to knee with walking stairs, repeated movements with walking, eases after one day.  Pain at worst 7/10, best 0/10. Pt has had 7 injections LBP which has helped. Easing factors: rest, NSAIDS, ice, time   3) R testicular pain for the past 5 years. Pt can reproduce the pain with pushing at inner R thigh sometimes. No pain with urination , sometimes pressure at the penile tip with urination and ejaculation. Nocturia: 2 x night on average. Sleep study results showed mild OSA but not warrant CPAP 2021.  Ejaculation and erections are weaker than normal.      PERTINENT HISTORY:    PAIN:  Are you having pain?   PRECAUTIONS: None  WEIGHT BEARING RESTRICTIONS: No  FALLS:  Has patient fallen in last 6  months? No  LIVING ENVIRONMENT: Lives with: lives with their spouse Lives in: House/apartment Stairs: 2 steps from garage   OCCUPATION: sedentary job, Audiological scientist   PLOF: Independent  PATIENT GOALS:     OBJECTIVE:    [facility] PT Assessment -      Palpation   SI assessment  levelled shoulder and iliac crest    Palpation comment tenderness / hypomobile T2-3 R ,  interspinals R              [facility] Adult PT Treatment/Exercise       Modalities   Modalities Moist Heat      Moist Heat Therapy   Moist Heat Location --   cervical ( unbilled)     Manual Therapy   Manual therapy comments medial glide at T2-3 R , STM/MWM at interspinals R    Neuro-Reedu --------------------------------------------------------------cued for a different position for cervicoscapular stabilization strengthening which pt did not report of N/T BUE           Therapeutic Activity -------------------------------------------------------modified new HEP to minimize BUE N/T       HOME EXERCISE PROGRAM: See pt instruction section    ASSESSMENT:  CLINICAL IMPRESSION:   Improvements:   Pt  's LBP has improved by > 50% and no longer radiates.    Constipation is still a struggle. It is slightly better in the mornings.  Straining 50% of the time instead of 90%.    Nocturia decreased from 2x night to 0-1 x  night.    No change with testicular pain.  Pt benefits from skilled PT.    OBJECTIVE IMPAIRMENTS decreased activity tolerance, decreased coordination, decreased endurance, decreased mobility, difficulty walking, decreased ROM, decreased strength, decreased safety awareness, hypomobility, increased muscle spasms, impaired flexibility, improper body mechanics, postural dysfunction, and pain.    ACTIVITY LIMITATIONS  self-care, sleep, home chores, work  tasks    PARTICIPATION LIMITATIONS:  community, gym activities    PERSONAL FACTORS        are also affecting patient's functional  outcome.    REHAB POTENTIAL: Good   CLINICAL DECISION MAKING: Evolving/moderate complexity   EVALUATION COMPLEXITY: Moderate    PATIENT EDUCATION:    Education details: Showed pt anatomy images. Explained muscles attachments/ connection, physiology of deep core system/ spinal- thoracic-pelvis-lower kinetic chain as they relate to pt's presentation, Sx, and past Hx. Explained what and how these areas of deficits need to be restored to balance and function    See Therapeutic activity / neuromuscular re-education section  Answered pt's questions.   Person educated: Patient Education method: Explanation, Demonstration, Tactile cues, Verbal cues, and Handouts Education comprehension: verbalized understanding, returned demonstration, verbal cues required, tactile cues required, and needs further education     PLAN: PT FREQUENCY: 1x/week   PT DURATION: 10 weeks   PLANNED INTERVENTIONS: Therapeutic exercises, Therapeutic activity, Neuromuscular re-education, Balance training, Gait training, Patient/Family education, Self Care, Joint mobilization, Spinal mobilization, Moist heat, Taping, and Manual therapy, dry needling.   PLAN FOR NEXT SESSION: See clinical impression for plan     GOALS: Goals reviewed with patient? Yes  SHORT TERM GOALS: Target date: 07/25/2022    Pt will demo IND with HEP                    Baseline: Not IND            Goal status: INITIAL   LONG TERM GOALS: Target date: 09/05/2022     1.Pt will demo proper deep core coordination without chest breathing and optimal excursion of diaphragm/pelvic floor in order to promote spinal stability and pelvic floor function  Baseline: dyscoordination Goal status: INITIAL  2.  Pt will demo > 5 pt change on FOTO  to improve QOL and function   Pelvic Pain baseline -25 Lower score = better function  Bowel  constipation baseline -49   Higher score = better function  PFDI Bowel -21  --> 13 pts POSITIVE IMPROVEMENT   ( 08/01/22)  Higher score = better function  Lumber baseline  -  58 pts   -> 65  ( 08/01/22)  Higher score = better function   Goal status: partially met    3.  Pt will demo proper body mechanics in against gravity tasks and ADLs  work tasks, fitness  to minimize straining pelvic floor / back                   Baseline: not IND, improper form that places strain on pelvic floor                Goal status: INITIAL    4.  Pt will report decreased straining with BMs < 50% of the time with taking Linzess in order to decrease  fecal seepage   Baseline: Pt strains 90% of the time. , fecal seepage occurs overnight   Goal status: MET ( 08/01/22: 50% of the time)     5. (Goal changed)   Pt will report nocturia will decrease to 0-1 x night   Baseline  Nocturia: 2 x night on average.  Goal status: MET ( 08/01/22: 0-1 x night)    6. Pt will demo levelled pelvic girdle and shoulder height in order to progress to deep core strengthening HEP and restore mobility at spine, pelvis, gait, posture    Baseline: R shoulder , L iliac  crest higher, L convex thoracic , R lumbar convex.  Supine: L iliac crest higher/ malleoli higher  Goal status: MET                          7. Pt will have no more radiating from the  knee                         Baseline:Radiates to knee with walking stairs, repeated movements with walking,                       Goal status: Ongoing                          8. Pt will report increased stool consistency Type 4 > 20% of the time in order to minimize straining                       Baseline: 0% of the time                       Goal Status: Ongoing    Jerl Mina, PT 08/01/2022, 4:36 PM

## 2022-08-01 NOTE — Patient Instructions (Signed)
Multifidis twist   Band is on doorknob: sit facing perpendicular to door , sit halfway towards front of chair, firm through 4 points of contact at buttocks and feet. Feet are placed hip with apart.   Twisting trunk without moving the hips and knees Hold band at the level of ribcage, elbows bent,shoulder blades roll back and down like squeezing a pencil under armpit   Exhale twist,.10-15 deg away from door without moving your hips/ knees, press more weight on the side of the sitting bones/ foot opp of your direction of turn as your counterweight. Continue to maintain equal weight through legs.  Keep knee unlocked.  30 reps   

## 2022-08-22 ENCOUNTER — Ambulatory Visit: Payer: 59 | Admitting: Physical Therapy

## 2022-08-22 DIAGNOSIS — M533 Sacrococcygeal disorders, not elsewhere classified: Secondary | ICD-10-CM

## 2022-08-22 DIAGNOSIS — M5459 Other low back pain: Secondary | ICD-10-CM

## 2022-08-22 NOTE — Patient Instructions (Signed)
Minisquat: Scoot buttocks back slight, hinge like you are looking at your reflection on a pond  Knees behind toes,  Inhale to "smell flowers"  Exhale on the rise "like rocket"  Do not lock knees, have more weight across ballmounds of feet, toes relaxed   THEN HALF step on both feet first lap with left foot leading down a hall way = 1 lap  Repeated with other foot leading =1 lap  2 laps each side    __  Band over door,   Pulldown Minisquat   15 reps    __  Heel raises L  10 reps each foot 3 x day

## 2022-08-22 NOTE — Therapy (Signed)
OUTPATIENT PHYSICAL THERAPY TREATMENT    Patient Name: Rick Scott MRN: [id number] DOB:04-09-1981, 42 y.o., male Today's Date: 08/22/2022   PT End of Session - 08/22/22 1644     Visit Number 7    Number of Visits 10    Date for PT Re-Evaluation 09/05/22    Authorization Type 60 max    PT Start Time 1641    PT Stop Time 1720    PT Time Calculation (min) 39 min    Activity Tolerance Patient tolerated treatment well    Behavior During Therapy WFL for tasks assessed/performed             Past Medical History:  Diagnosis Date   Allergy bananas, oranges, melons   Asthma 1996   Well controlled   Chronic diarrhea    has improved recently as diet has improved   Colon polyps    Eczema    GERD (gastroesophageal reflux disease)    Past Surgical History:  Procedure Laterality Date   FINGER SURGERY Right    5th digit   GANGLION CYST EXCISION     Patient Active Problem List   Diagnosis Date Noted   OSA (obstructive sleep apnea) 07/01/2019   Asthma, moderate persistent 04/28/2019   BMI 34.0-34.9,adult 04/28/2019   Abdominal pain with radiation to back 11/12/2017   Testicular pain, right 11/11/2017   Family history of colonic polyps 06/17/2015   Chronic diarrhea 05/18/2015   Low back pain, unspecified 05/18/2015   Joint pain 04/30/2004   Asymptomatic varicose veins of unspecified lower extremity 04/30/1998   Irritable bowel syndrome with constipation 08/29/1994   Gastric reflux 04/30/1994   Asthma due to environmental allergies 04/30/1990   Allergies 04-25-1981    PCP: Cathie Hoops PA  REFERRING PROVIDER: Cathie Hoops PA  REFERRING DIAG:  K59.09 (ICD-10-CM) - Other constipation  M54.50 (ICD-10-CM) - Low back pain, unspecified  G89.29 (ICD-10-CM) - Other chronic pain   Rationale for Evaluation and Treatment Rehabilitation  THERAPY DIAG:  Other low back pain  Sacrococcygeal disorders, not elsewhere classified  ONSET DATE:    SUBJECTIVE:                     SUBJECTIVE STATEMENT TODAY:  Pt  's LBP has improved by  60% compared to [facility]  and no longer radiates.    Constipation is less of a struggle. Straining 50% of the time instead of 90%.  Pt has BMs with less medication  Nocturia remains consistently  0-1 x  night.   Urge and urinary frequency improved by 75% since SOC.                 Testicular pain is 15% improved  SUBJECTIVE STATEMENT  on EVAL  06/27/22 : 1) constipation / fecal seepage:    Pt  has been an issue since he was 68-32 years old. Pt had anxiety of embarrassment as a childhood with smell, odor sound. Pt will use public restroom when he has the urge for bowel movement. When he has the urge, nothing happens.  Pt takes Linzess for 7 years . Pt will go 4-7 x  in a row with Stool type 7 stool consistency within 2 hours. With out Linzess, Type 1 stool. Daily fluid intake: 60-80 oz of water, 12 fl oz soda, 24 oz coffee, eats lots of green vegetables.  Pt strains 90% of the time.   Fecal seepage occurs overnight and in the morning.    2) R radiating LBP for the past 20 years with herniated discs. No acute injuries. Radiates to knee with walking stairs, repeated movements with walking, eases after one day.  Pain at worst 7/10, best 0/10. Pt has had 7 injections LBP which has helped. Easing factors: rest, NSAIDS, ice, time   3) R testicular pain for the past 5 years. Pt can reproduce the pain with pushing at inner R thigh sometimes. No pain with urination , sometimes pressure at the penile tip with urination and ejaculation. Nocturia: 2 x night on average. Sleep study results showed mild OSA but not warrant CPAP 2021.  Ejaculation and erections are weaker than normal.      PERTINENT HISTORY:    PAIN:  Are you having pain?   PRECAUTIONS:  None  WEIGHT BEARING RESTRICTIONS: No  FALLS:  Has patient fallen in last 6 months? No  LIVING ENVIRONMENT: Lives with: lives with their spouse Lives in: House/apartment Stairs: 2 steps from garage   OCCUPATION: sedentary job, Teacher, English as a foreign language   PLOF: Independent  PATIENT GOALS:     OBJECTIVE:    [facility] PT Assessment - 08/22/22 1721       Coordination   Coordination and Movement Description poor feet coactivation of tranverse arch in squats, hyperextended knees on rise in squat             [facility] Adult PT Treatment/Exercise - 08/22/22 1722       Therapeutic Activites    Other Therapeutic Activities explained and showed anatomy images of thoracolumbar/glut system for minimizing pelvic issues      Neuro Re-ed    Neuro Re-ed Details  excessive cues for proper squat technique and new strengthening HEP              HOME EXERCISE PROGRAM: See pt instruction section    ASSESSMENT:  CLINICAL IMPRESSION:   Improvements:    Pt  's LBP has improved by  60% compared to [facility]  and no longer radiates.   Constipation is less of a struggle. Straining 50% of the time instead of 90%.  Pt has BMs with less medication  Nocturia remains consistently  0-1 x  night.   Urge and urinary frequency improved by 75% since SOC.  Testicular pain is 15% improved       Added thoracolumbar/glut system HEP. Excessive cues provided for proper squat technique and new strengthening HEP  Pt remains compliant and will progress well towards remaining goals. Pt benefits from skilled PT.    OBJECTIVE IMPAIRMENTS decreased activity tolerance, decreased coordination, decreased endurance, decreased mobility, difficulty walking, decreased ROM, decreased strength, decreased safety awareness, hypomobility, increased muscle spasms, impaired flexibility, improper body mechanics, postural dysfunction, and pain.    ACTIVITY LIMITATIONS  self-care, sleep, home chores, work tasks    PARTICIPATION  LIMITATIONS:  community, gym activities    PERSONAL FACTORS        are also affecting patient's functional outcome.    REHAB POTENTIAL: Good   CLINICAL DECISION MAKING: Evolving/moderate complexity   EVALUATION COMPLEXITY: Moderate    PATIENT EDUCATION:    Education details: Showed pt anatomy images. Explained muscles attachments/ connection, physiology of deep core system/ spinal- thoracic-pelvis-lower kinetic chain as they relate to pt's presentation, Sx, and past Hx. Explained what and how these areas of deficits need to be restored to balance and function    See Therapeutic activity / neuromuscular re-education section  Answered pt's questions.   Person educated: Patient Education method: Explanation, Demonstration, Tactile cues, Verbal cues, and Handouts Education comprehension: verbalized understanding, returned demonstration, verbal cues required, tactile cues required, and needs further education     PLAN: PT FREQUENCY: 1x/week   PT DURATION: 10 weeks   PLANNED INTERVENTIONS: Therapeutic exercises, Therapeutic activity, Neuromuscular re-education, Balance training, Gait training, Patient/Family education, Self Care, Joint mobilization, Spinal mobilization, Moist heat, Taping, and Manual therapy, dry needling.   PLAN FOR NEXT SESSION: See clinical impression for plan     GOALS: Goals reviewed with patient? Yes  SHORT TERM GOALS: Target date: 07/25/2022    Pt will demo IND with HEP                    Baseline: Not IND            Goal status: INITIAL   LONG TERM GOALS: Target date: 09/05/2022     1.Pt will demo proper deep core coordination without chest breathing and optimal excursion of diaphragm/pelvic floor in order to promote spinal stability and pelvic floor function  Baseline: dyscoordination Goal status: MET   2.  Pt will demo > 5 pt change on FOTO  to improve QOL and function   Pelvic Pain baseline -25 Lower score = better function  Bowel   constipation baseline -49   Higher score = better function  PFDI Bowel -21  --> 13 pts POSITIVE IMPROVEMENT  ( 08/01/22)  Higher score = better function  Lumber baseline  -  58 pts   -> 65  ( 08/01/22)  Higher score = better function   Goal status: partially met    3.  Pt will demo proper body mechanics in against gravity tasks and ADLs  work tasks, fitness  to minimize straining pelvic floor / back                   Baseline: not IND, improper form that places strain on pelvic floor                Goal status: MET    4.  Pt will report decreased straining with BMs < 50% of the time with taking Linzess in order to decrease  fecal seepage   Baseline: Pt strains 90% of the time. , fecal seepage occurs overnight   Goal status: MET ( 08/01/22: 50% of the time)     5. (Goal changed)   Pt will report nocturia will decrease to 0-1 x night   Baseline  Nocturia: 2 x night on average.  Goal status: MET ( 08/01/22: 0-1 x night)    6. Pt will demo levelled pelvic girdle and shoulder height in order to progress to deep core strengthening HEP and restore mobility at spine, pelvis, gait, posture  Baseline: R shoulder , L iliac crest higher, L convex thoracic , R lumbar convex.  Supine: L iliac crest higher/ malleoli higher  Goal status: MET                          7. Pt will have no more radiating from the  knee                         Baseline:Radiates to knee with walking stairs, repeated movements with walking,                       Goal status: MET                         8. Pt will report increased stool consistency Type 4 > 20% of the time in order to minimize straining                       Baseline: 0% of the time                       Goal Status: On going  Mariane Masters, PT 08/22/2022, 4:45 PM

## 2022-08-28 ENCOUNTER — Other Ambulatory Visit (HOSPITAL_COMMUNITY): Payer: Self-pay | Admitting: Otolaryngology

## 2022-08-28 DIAGNOSIS — R1310 Dysphagia, unspecified: Secondary | ICD-10-CM

## 2022-09-06 ENCOUNTER — Ambulatory Visit
Admission: RE | Admit: 2022-09-06 | Discharge: 2022-09-06 | Disposition: A | Discharge status: Home / Self Care | Payer: 59 | Source: Ambulatory Visit | Attending: Otolaryngology | Admitting: Otolaryngology

## 2022-09-06 DIAGNOSIS — R1310 Dysphagia, unspecified: Secondary | ICD-10-CM | POA: Insufficient documentation

## 2022-09-25 ENCOUNTER — Ambulatory Visit: Payer: 59 | Admitting: Physical Therapy

## 2022-10-08 ENCOUNTER — Telehealth: Payer: Self-pay

## 2022-10-08 NOTE — Telephone Encounter (Signed)
Patient was here in September of 2022 and you documented: Epidermal cyst with Hidradenitis Right Axilla, R groin Discussed treatment options. Pt opts for excision.  Will schedule surgery x 2 sessions.  Patient sent this My Chart appt request today:  I need one, possibly two, ingrown hair cysts removed or drained with the hair removed. One is on my neck by my throat, the other is on my thigh. I have previously consulted with Dr. Gwen Pounds about this issue, so ideally, I would just like to schedule the procedure with whomever could do it.   Does the patient need to be seen again or schedule surgery?

## 2022-10-09 NOTE — Telephone Encounter (Signed)
MyCHart message sent. aw

## 2022-10-15 ENCOUNTER — Encounter: Payer: 59 | Admitting: Physical Therapy

## 2022-10-17 ENCOUNTER — Encounter: Payer: 59 | Admitting: Physical Therapy

## 2022-10-22 ENCOUNTER — Encounter: Payer: 59 | Admitting: Physical Therapy

## 2022-10-24 ENCOUNTER — Encounter: Payer: 59 | Admitting: Physical Therapy

## 2022-11-07 ENCOUNTER — Ambulatory Visit: Payer: 59 | Admitting: Physical Therapy

## 2022-11-14 ENCOUNTER — Ambulatory Visit: Payer: 59 | Admitting: Physical Therapy

## 2022-12-07 ENCOUNTER — Other Ambulatory Visit: Payer: Self-pay | Admitting: Pulmonary Disease

## 2022-12-12 ENCOUNTER — Encounter: Payer: Self-pay | Admitting: Pulmonary Disease

## 2022-12-19 ENCOUNTER — Other Ambulatory Visit: Payer: Self-pay | Admitting: Oncology

## 2022-12-19 DIAGNOSIS — Z006 Encounter for examination for normal comparison and control in clinical research program: Secondary | ICD-10-CM

## 2022-12-21 ENCOUNTER — Other Ambulatory Visit
Admission: RE | Admit: 2022-12-21 | Discharge: 2022-12-21 | Disposition: A | Discharge status: Home / Self Care | Payer: 59 | Attending: Oncology | Admitting: Oncology

## 2022-12-21 DIAGNOSIS — Z006 Encounter for examination for normal comparison and control in clinical research program: Secondary | ICD-10-CM

## 2023-01-08 LAB — GENECONNECT MOLECULAR SCREEN: Genetic Analysis Overall Interpretation: NEGATIVE

## 2023-03-02 ENCOUNTER — Other Ambulatory Visit: Payer: Self-pay | Admitting: Pulmonary Disease

## 2023-06-28 IMAGING — MR MR LUMBAR SPINE W/O CM
4 of 5 series · 24 of 48 positions shown · non-contrast
Comparison: Prior radiograph from 04/20/2021.

CLINICAL DATA: Initial evaluation for left L5 lumbar radiculopathy,
low back pain.

EXAM:
MRI LUMBAR SPINE WITHOUT CONTRAST
TECHNIQUE: Multiplanar, multisequence MR imaging of the lumbar spine was
performed. No intravenous contrast was administered.

[Series 2: T2 · sagittal · 4.0mm · 0.59mm/px · 6 of 15 slices shown (1 of 2)]
[im 1/15]
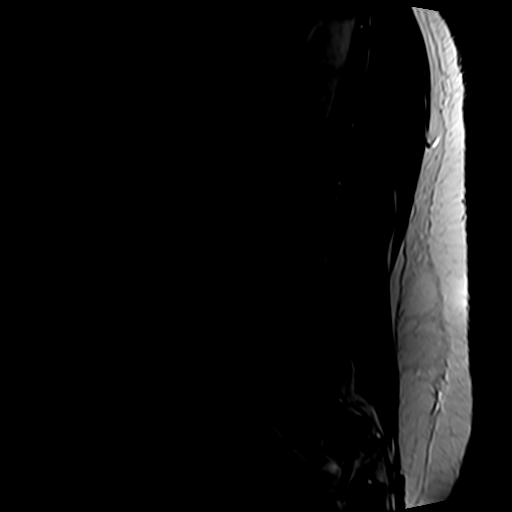
[im 3/15]
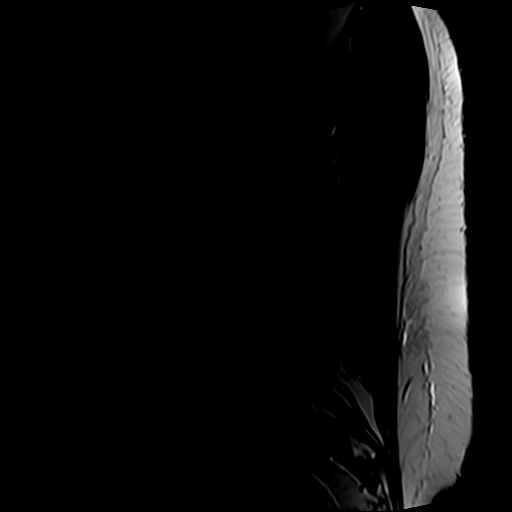
[im 6/15]
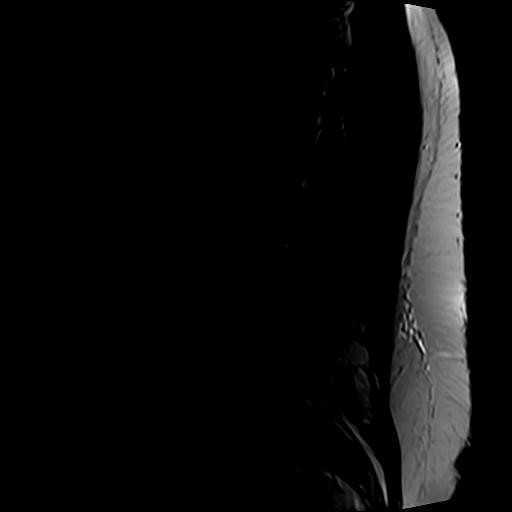
[im 9/15]
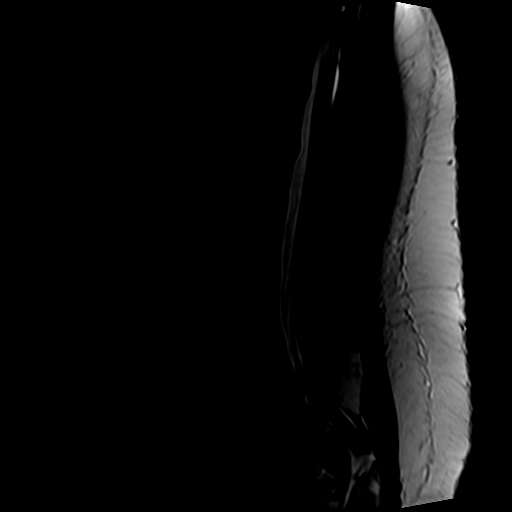
[im 12/15]
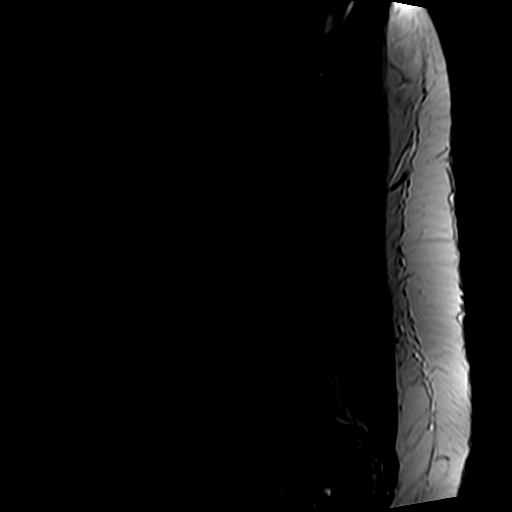
[im 15/15]
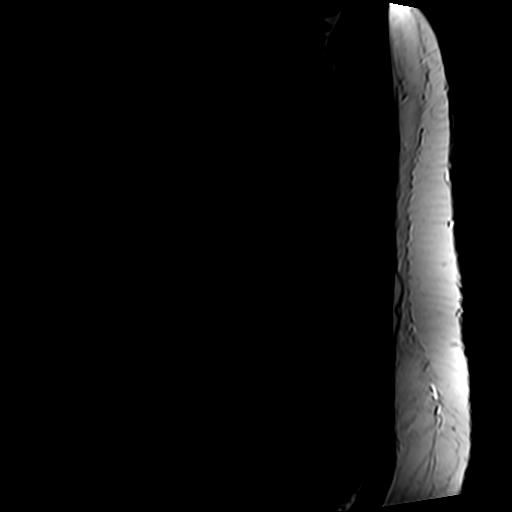

[Series 4: T1 · sagittal · 4.0mm · 0.59mm/px · 6 of 15 slices shown (1 of 2)]
[im 1/15]
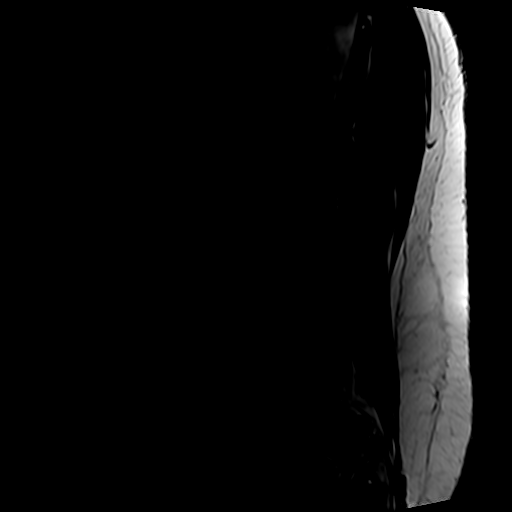
[im 3/15]
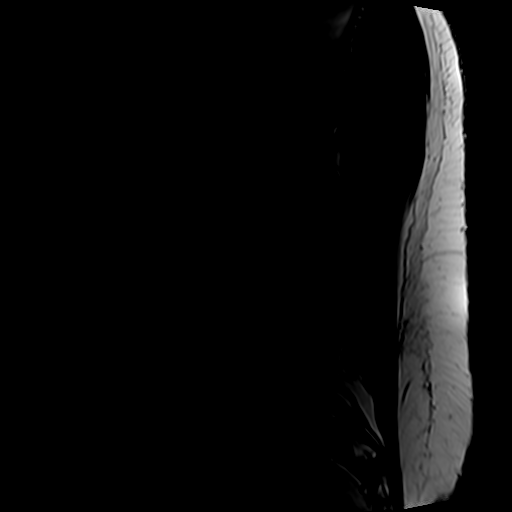
[im 6/15]
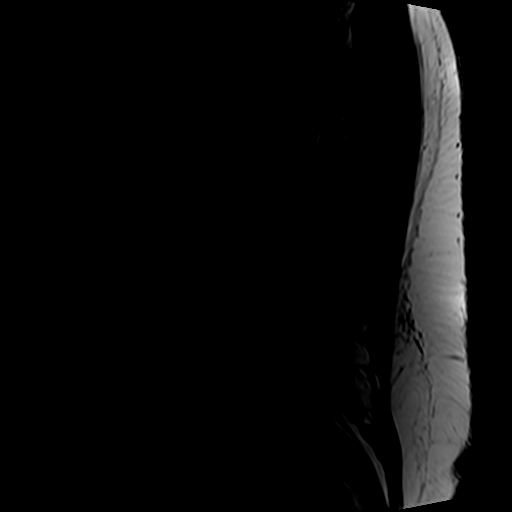
[im 9/15]
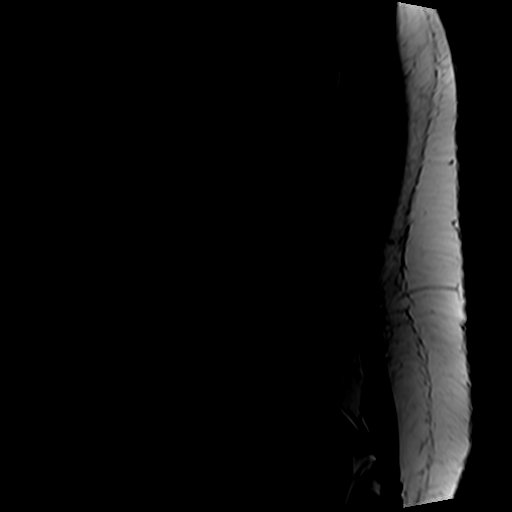
[im 12/15]
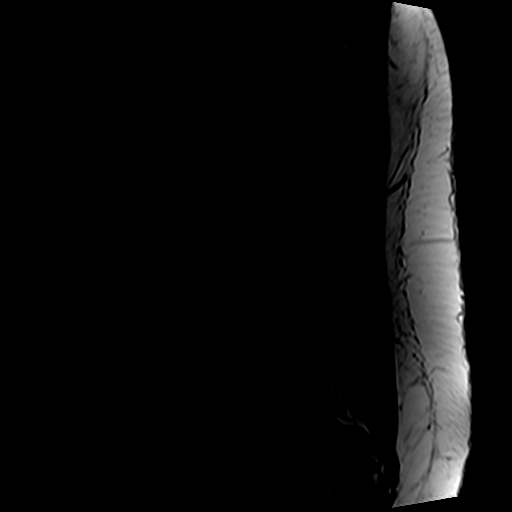
[im 15/15]
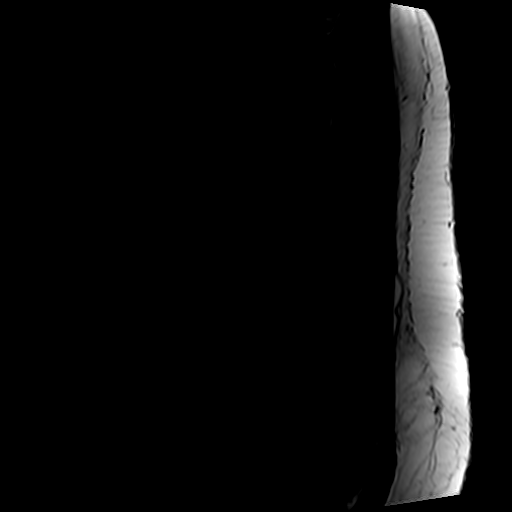

[Series 5: T2 · axial · 4.0mm · 0.70mm/px · z∈[-111,+116]mm · 9 of 40 slices shown (2 of 2)]
[im 1/40]
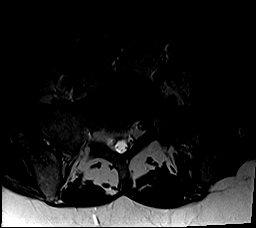
[im 6/40]
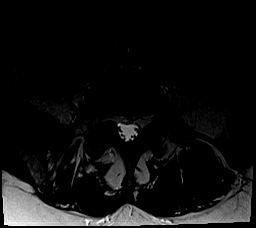
[im 12/40]
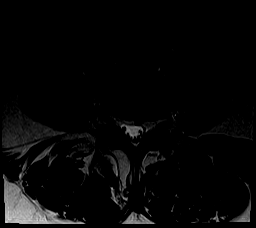
[im 17/40]
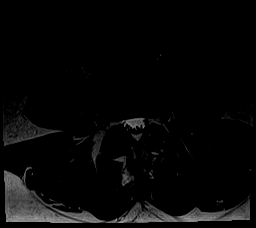
[im 20/40]
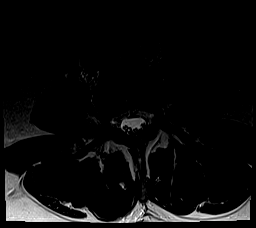
[im 23/40]
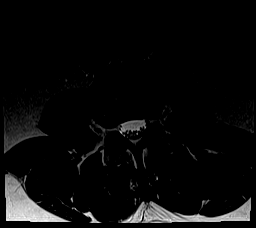
[im 28/40]
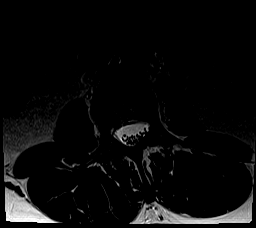
[im 34/40]
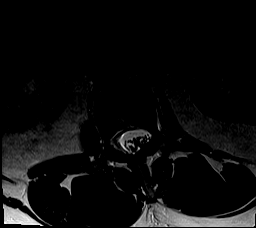
[im 40/40]
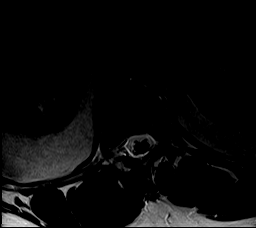

[Series 6: T1 · axial · 4.0mm · 0.35mm/px · z∈[-86,+85]mm · 3 of 40 slices shown (2 of 2)]
[im 6/40]
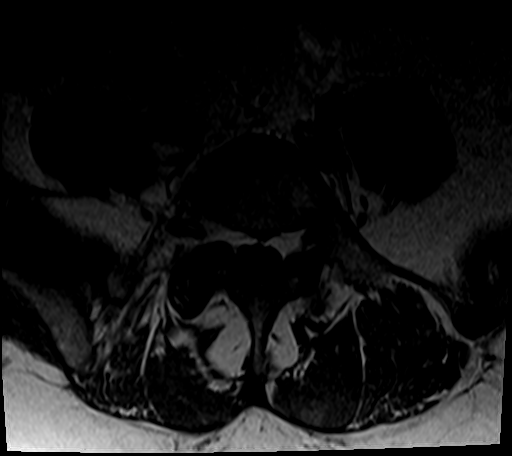
[im 20/40]
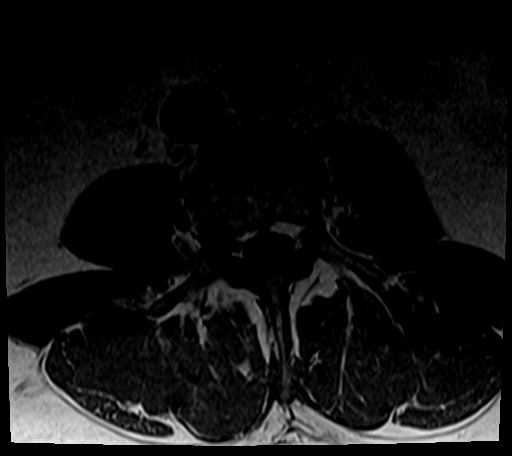
[im 34/40]
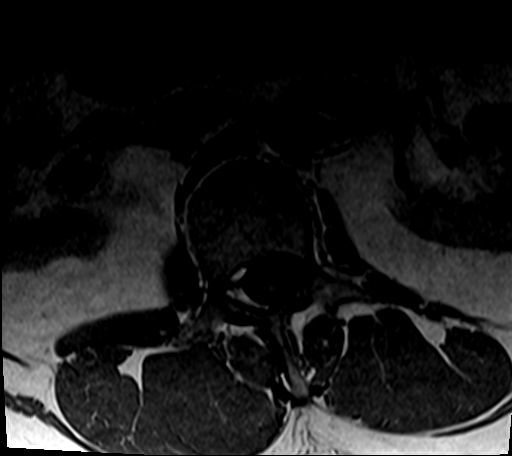

[24 of 48 positions shown; findings below may reference images not displayed]

FINDINGS: Segmentation: Standard. Lowest well-formed disc space labeled the
L5-S1 level.

Alignment: Sigmoid scoliotic curvature of the lumbar spine. 2-3 mm
anterolisthesis of L4 on L5, with trace 2 mm anterolisthesis of L5
on S1. Findings chronic and facet mediated.

Vertebrae: Vertebral body height maintained without acute or chronic
fracture. Bone marrow signal intensity within normal limits.
Subcentimeter benign hemangioma noted within the L3 vertebral body.
No other discrete or worrisome osseous lesions. No abnormal marrow
edema.

Conus medullaris and cauda equina: Conus extends to the T12-L1
level. Conus and cauda equina appear normal.

Paraspinal and other soft tissues: Paraspinous soft tissues within
normal limits. Subcentimeter simple cyst noted within the interpolar
right kidney. Visualized visceral structures otherwise unremarkable.

Disc levels:

L1-2: Disc desiccation with mild disc bulge. Mild facet hypertrophy.
No spinal stenosis. Foramina remain patent.

L2-3: Negative interspace. Mild bilateral facet hypertrophy. No
spinal stenosis. Foramina remain patent.

L3-4: Degenerative intervertebral disc space narrowing with diffuse
disc bulge and disc desiccation. Superimposed left extraforaminal
disc protrusion with annular fissure closely approximates the
exiting left L3 nerve root (series 5, image 24). Moderate bilateral
facet hypertrophy. No significant spinal stenosis. Foramina remain
patent.

L4-5: Trace anterolisthesis with mild intervertebral disc space
narrowing. Disc desiccation with mild disc bulge. Superimposed left
extraforaminal disc protrusion contacts the exiting left L4 nerve
root (series 5, image 31). Moderate bilateral facet hypertrophy.
Resultant mild spinal stenosis with mild bilateral L4 foraminal
narrowing.

L5-S1: Degenerative intervertebral disc space narrowing with disc
desiccation and diffuse disc bulge. Superimposed lobulated central
to left foraminal disc protrusion (series 5, image 37). Protruding
disc closely approximates the descending left S1 nerve root without
frank impingement. Moderate right worse than left facet arthrosis.
Resultant mild left greater than right lateral recess stenosis.
Central canal remains patent. No significant foraminal encroachment.
IMPRESSION: 1. Left extraforaminal disc protrusion at L3-4, closely
approximating and potentially irritating the exiting left L3 nerve
root.
2. Left extraforaminal disc protrusion at L4-5, contacting the
exiting left L4 nerve root.
3. Central to left foraminal disc protrusion at L5-S1, closely
approximating and potentially irritating the descending left S1
nerve root.
4. Scoliosis with moderate bilateral facet hypertrophy at L3-4
through L5-S1. Finding could contribute to lower back pain.

## 2023-12-26 ENCOUNTER — Other Ambulatory Visit: Payer: Self-pay | Admitting: Pulmonary Disease

## 2024-01-27 ENCOUNTER — Other Ambulatory Visit: Payer: Self-pay | Admitting: Pulmonary Disease

## 2024-04-05 ENCOUNTER — Other Ambulatory Visit: Payer: Self-pay | Admitting: Pulmonary Disease

## 2024-04-08 ENCOUNTER — Other Ambulatory Visit: Payer: Self-pay

## 2024-04-08 NOTE — Progress Notes (Signed)
 Open in error

## 2024-04-27 ENCOUNTER — Telehealth: Payer: Self-pay

## 2024-04-27 NOTE — Telephone Encounter (Signed)
 Patient not seen in clinic since 2024. Okay to refill [facility]er [facility] [address]

## 2024-04-28 MED ORDER — FLUTICASONE FUROATE-VILANTEROL 200-25 MCG/ACT IN AEPB: 1.0000 | INHALATION_SPRAY | Freq: Every day | RESPIRATORY_TRACT | 1 refills | Status: AC

## 2024-04-28 NOTE — Telephone Encounter (Signed)
 Ok to refill breo

## 2024-04-28 NOTE — Telephone Encounter (Signed)
 Rx refilled.
